# Patient Record
Sex: Female | Born: 1935 | Race: White | Hispanic: Yes | Smoking: Never smoker
Health system: Southern US, Community
[De-identification: ages and names within clinical notes are randomized; demographics above are authoritative.]

## PROBLEM LIST (undated history)

## (undated) DIAGNOSIS — R0602 Shortness of breath: Secondary | ICD-10-CM

## (undated) DIAGNOSIS — I2510 Atherosclerotic heart disease of native coronary artery without angina pectoris: Secondary | ICD-10-CM

## (undated) DIAGNOSIS — Z95810 Presence of automatic (implantable) cardiac defibrillator: Secondary | ICD-10-CM

## (undated) DIAGNOSIS — R609 Edema, unspecified: Secondary | ICD-10-CM

## (undated) DIAGNOSIS — Z950 Presence of cardiac pacemaker: Secondary | ICD-10-CM

## (undated) DIAGNOSIS — I10 Essential (primary) hypertension: Secondary | ICD-10-CM

## (undated) DIAGNOSIS — R55 Syncope and collapse: Secondary | ICD-10-CM

## (undated) DIAGNOSIS — W19XXXA Unspecified fall, initial encounter: Secondary | ICD-10-CM

## (undated) DIAGNOSIS — E119 Type 2 diabetes mellitus without complications: Secondary | ICD-10-CM

## (undated) HISTORY — PX: BACK SURGERY: SHX140

## (undated) HISTORY — DX: Atherosclerotic heart disease of native coronary artery without angina pectoris: I25.10

## (undated) HISTORY — DX: Edema, unspecified: R60.9

## (undated) HISTORY — DX: Essential (primary) hypertension: I10

## (undated) HISTORY — DX: Type 2 diabetes mellitus without complications: E11.9

## (undated) HISTORY — PX: EYE SURGERY: SHX253

## (undated) HISTORY — DX: Shortness of breath: R06.02

## (undated) HISTORY — DX: Syncope and collapse: R55

---

## 1998-12-19 ENCOUNTER — Ambulatory Visit (HOSPITAL_COMMUNITY): Admission: RE | Admit: 1998-12-19 | Discharge: 1998-12-19 | Payer: Self-pay

## 2002-06-25 ENCOUNTER — Encounter: Payer: Self-pay | Admitting: Emergency Medicine

## 2002-06-25 ENCOUNTER — Emergency Department (HOSPITAL_COMMUNITY): Admission: EM | Admit: 2002-06-25 | Discharge: 2002-06-25 | Payer: Self-pay | Admitting: Emergency Medicine

## 2003-09-11 ENCOUNTER — Ambulatory Visit (HOSPITAL_COMMUNITY): Admission: RE | Admit: 2003-09-11 | Discharge: 2003-09-11 | Payer: Self-pay | Admitting: Family Medicine

## 2003-10-22 ENCOUNTER — Encounter (INDEPENDENT_AMBULATORY_CARE_PROVIDER_SITE_OTHER): Payer: Self-pay | Admitting: Specialist

## 2003-10-22 ENCOUNTER — Encounter: Admission: RE | Admit: 2003-10-22 | Discharge: 2003-10-22 | Payer: Self-pay | Admitting: Internal Medicine

## 2003-11-11 ENCOUNTER — Encounter (INDEPENDENT_AMBULATORY_CARE_PROVIDER_SITE_OTHER): Payer: Self-pay | Admitting: *Deleted

## 2003-11-11 ENCOUNTER — Ambulatory Visit (HOSPITAL_COMMUNITY): Admission: RE | Admit: 2003-11-11 | Discharge: 2003-11-11 | Payer: Self-pay | Admitting: General Surgery

## 2003-11-11 ENCOUNTER — Ambulatory Visit (HOSPITAL_BASED_OUTPATIENT_CLINIC_OR_DEPARTMENT_OTHER): Admission: RE | Admit: 2003-11-11 | Discharge: 2003-11-11 | Payer: Self-pay | Admitting: General Surgery

## 2003-11-11 ENCOUNTER — Encounter: Admission: RE | Admit: 2003-11-11 | Discharge: 2003-11-11 | Payer: Self-pay | Admitting: General Surgery

## 2003-11-12 ENCOUNTER — Encounter (INDEPENDENT_AMBULATORY_CARE_PROVIDER_SITE_OTHER): Payer: Self-pay | Admitting: Cardiology

## 2003-11-12 ENCOUNTER — Ambulatory Visit (HOSPITAL_COMMUNITY): Admission: RE | Admit: 2003-11-12 | Discharge: 2003-11-12 | Payer: Self-pay | Admitting: Internal Medicine

## 2004-03-23 ENCOUNTER — Ambulatory Visit: Payer: Self-pay | Admitting: Nurse Practitioner

## 2004-05-18 ENCOUNTER — Ambulatory Visit: Payer: Self-pay | Admitting: Nurse Practitioner

## 2004-06-22 ENCOUNTER — Ambulatory Visit: Payer: Self-pay | Admitting: Nurse Practitioner

## 2004-07-17 ENCOUNTER — Ambulatory Visit: Payer: Self-pay | Admitting: Family Medicine

## 2004-07-27 ENCOUNTER — Ambulatory Visit: Payer: Self-pay | Admitting: Nurse Practitioner

## 2004-10-14 ENCOUNTER — Ambulatory Visit: Payer: Self-pay | Admitting: Nurse Practitioner

## 2004-11-23 ENCOUNTER — Encounter: Admission: RE | Admit: 2004-11-23 | Discharge: 2004-11-23 | Payer: Self-pay | Admitting: General Surgery

## 2005-03-04 ENCOUNTER — Ambulatory Visit: Payer: Self-pay | Admitting: Nurse Practitioner

## 2005-04-07 ENCOUNTER — Ambulatory Visit: Payer: Self-pay | Admitting: Nurse Practitioner

## 2005-07-16 ENCOUNTER — Ambulatory Visit: Payer: Self-pay | Admitting: Nurse Practitioner

## 2005-07-23 ENCOUNTER — Ambulatory Visit: Payer: Self-pay | Admitting: Ophthalmology

## 2005-07-28 ENCOUNTER — Ambulatory Visit: Payer: Self-pay | Admitting: Ophthalmology

## 2005-11-17 ENCOUNTER — Ambulatory Visit: Payer: Self-pay | Admitting: Nurse Practitioner

## 2005-12-29 ENCOUNTER — Encounter: Admission: RE | Admit: 2005-12-29 | Discharge: 2005-12-29 | Payer: Self-pay | Admitting: General Surgery

## 2006-02-22 ENCOUNTER — Ambulatory Visit: Payer: Self-pay | Admitting: Nurse Practitioner

## 2006-07-08 ENCOUNTER — Ambulatory Visit: Payer: Self-pay | Admitting: Nurse Practitioner

## 2006-09-28 ENCOUNTER — Ambulatory Visit: Payer: Self-pay | Admitting: Nurse Practitioner

## 2006-12-02 ENCOUNTER — Ambulatory Visit: Payer: Self-pay | Admitting: Nurse Practitioner

## 2007-01-30 ENCOUNTER — Ambulatory Visit (HOSPITAL_COMMUNITY): Admission: RE | Admit: 2007-01-30 | Discharge: 2007-01-30 | Payer: Self-pay | Admitting: Nurse Practitioner

## 2007-02-23 ENCOUNTER — Ambulatory Visit: Payer: Self-pay | Admitting: Internal Medicine

## 2007-02-23 ENCOUNTER — Encounter (INDEPENDENT_AMBULATORY_CARE_PROVIDER_SITE_OTHER): Payer: Self-pay | Admitting: Nurse Practitioner

## 2007-02-23 LAB — CONVERTED CEMR LAB
ALT: 46 units/L — ABNORMAL HIGH (ref 0–35)
AST: 51 units/L — ABNORMAL HIGH (ref 0–37)
Basophils Relative: 0 % (ref 0–1)
CO2: 25 meq/L (ref 19–32)
Chloride: 106 meq/L (ref 96–112)
Eosinophils Absolute: 0.5 10*3/uL (ref 0.0–0.7)
Glucose, Bld: 149 mg/dL — ABNORMAL HIGH (ref 70–99)
HCT: 44.5 % (ref 36.0–46.0)
MCHC: 31.5 g/dL (ref 30.0–36.0)
MCV: 85.7 fL (ref 78.0–100.0)
Monocytes Absolute: 0.7 10*3/uL (ref 0.2–0.7)
Neutrophils Relative %: 54 % (ref 43–77)
Platelets: 206 10*3/uL (ref 150–400)
Potassium: 5 meq/L (ref 3.5–5.3)
Sodium: 143 meq/L (ref 135–145)
TSH: 8.915 microintl units/mL — ABNORMAL HIGH (ref 0.350–5.50)
Total Bilirubin: 0.3 mg/dL (ref 0.3–1.2)
WBC: 9 10*3/uL (ref 4.0–10.5)

## 2007-02-24 ENCOUNTER — Encounter (INDEPENDENT_AMBULATORY_CARE_PROVIDER_SITE_OTHER): Payer: Self-pay | Admitting: Nurse Practitioner

## 2007-02-24 LAB — CONVERTED CEMR LAB: TSH: 8.915 microintl units/mL

## 2007-05-09 ENCOUNTER — Encounter (INDEPENDENT_AMBULATORY_CARE_PROVIDER_SITE_OTHER): Payer: Self-pay | Admitting: Nurse Practitioner

## 2007-05-09 DIAGNOSIS — E119 Type 2 diabetes mellitus without complications: Secondary | ICD-10-CM | POA: Insufficient documentation

## 2007-05-09 DIAGNOSIS — Z8619 Personal history of other infectious and parasitic diseases: Secondary | ICD-10-CM

## 2007-05-09 DIAGNOSIS — K219 Gastro-esophageal reflux disease without esophagitis: Secondary | ICD-10-CM

## 2007-05-09 DIAGNOSIS — I10 Essential (primary) hypertension: Secondary | ICD-10-CM | POA: Insufficient documentation

## 2007-05-09 DIAGNOSIS — M25569 Pain in unspecified knee: Secondary | ICD-10-CM | POA: Insufficient documentation

## 2007-05-09 DIAGNOSIS — M545 Low back pain: Secondary | ICD-10-CM

## 2007-05-09 DIAGNOSIS — E0590 Thyrotoxicosis, unspecified without thyrotoxic crisis or storm: Secondary | ICD-10-CM | POA: Insufficient documentation

## 2007-05-09 DIAGNOSIS — J309 Allergic rhinitis, unspecified: Secondary | ICD-10-CM | POA: Insufficient documentation

## 2008-10-05 ENCOUNTER — Inpatient Hospital Stay (HOSPITAL_COMMUNITY): Admission: EM | Admit: 2008-10-05 | Discharge: 2008-10-10 | Payer: Self-pay | Admitting: Emergency Medicine

## 2008-10-05 ENCOUNTER — Ambulatory Visit: Payer: Self-pay | Admitting: Internal Medicine

## 2008-10-07 ENCOUNTER — Encounter (INDEPENDENT_AMBULATORY_CARE_PROVIDER_SITE_OTHER): Payer: Self-pay | Admitting: Emergency Medicine

## 2008-10-21 ENCOUNTER — Encounter: Payer: Self-pay | Admitting: Cardiovascular Disease

## 2008-10-21 ENCOUNTER — Emergency Department (HOSPITAL_COMMUNITY): Admission: EM | Admit: 2008-10-21 | Discharge: 2008-10-21 | Payer: Self-pay | Admitting: Emergency Medicine

## 2009-03-11 ENCOUNTER — Encounter: Admission: RE | Admit: 2009-03-11 | Discharge: 2009-03-11 | Payer: Self-pay | Admitting: Internal Medicine

## 2009-03-12 ENCOUNTER — Encounter: Admission: RE | Admit: 2009-03-12 | Discharge: 2009-03-12 | Payer: Self-pay | Admitting: Internal Medicine

## 2009-03-20 ENCOUNTER — Encounter: Payer: Self-pay | Admitting: Internal Medicine

## 2009-03-26 ENCOUNTER — Ambulatory Visit (HOSPITAL_COMMUNITY): Admission: RE | Admit: 2009-03-26 | Discharge: 2009-03-26 | Payer: Self-pay | Admitting: Interventional Radiology

## 2009-04-16 ENCOUNTER — Encounter: Payer: Self-pay | Admitting: Interventional Radiology

## 2009-04-22 ENCOUNTER — Ambulatory Visit: Payer: Self-pay | Admitting: Internal Medicine

## 2009-04-22 DIAGNOSIS — J984 Other disorders of lung: Secondary | ICD-10-CM

## 2009-05-01 ENCOUNTER — Ambulatory Visit (HOSPITAL_COMMUNITY): Admission: RE | Admit: 2009-05-01 | Discharge: 2009-05-01 | Payer: Self-pay | Admitting: Interventional Radiology

## 2009-05-07 ENCOUNTER — Ambulatory Visit (HOSPITAL_COMMUNITY): Admission: RE | Admit: 2009-05-07 | Discharge: 2009-05-07 | Payer: Self-pay | Admitting: Interventional Radiology

## 2009-05-07 ENCOUNTER — Encounter (INDEPENDENT_AMBULATORY_CARE_PROVIDER_SITE_OTHER): Payer: Self-pay | Admitting: Interventional Radiology

## 2009-05-21 ENCOUNTER — Ambulatory Visit (HOSPITAL_COMMUNITY): Admission: RE | Admit: 2009-05-21 | Discharge: 2009-05-21 | Payer: Self-pay | Admitting: Interventional Radiology

## 2009-05-30 ENCOUNTER — Ambulatory Visit: Payer: Self-pay | Admitting: Internal Medicine

## 2009-06-09 ENCOUNTER — Encounter: Admission: RE | Admit: 2009-06-09 | Discharge: 2009-06-09 | Payer: Self-pay | Admitting: Internal Medicine

## 2009-06-09 ENCOUNTER — Ambulatory Visit: Payer: Self-pay | Admitting: Internal Medicine

## 2009-06-16 ENCOUNTER — Ambulatory Visit: Payer: Self-pay | Admitting: Internal Medicine

## 2009-07-01 ENCOUNTER — Emergency Department (HOSPITAL_COMMUNITY): Admission: EM | Admit: 2009-07-01 | Discharge: 2009-07-01 | Payer: Self-pay | Admitting: Family Medicine

## 2009-07-02 ENCOUNTER — Ambulatory Visit: Payer: Self-pay | Admitting: Internal Medicine

## 2009-07-02 ENCOUNTER — Inpatient Hospital Stay (HOSPITAL_COMMUNITY): Admission: EM | Admit: 2009-07-02 | Discharge: 2009-07-07 | Payer: Self-pay | Admitting: Emergency Medicine

## 2009-07-02 ENCOUNTER — Encounter: Payer: Self-pay | Admitting: Cardiology

## 2009-07-29 ENCOUNTER — Ambulatory Visit: Payer: Self-pay | Admitting: Internal Medicine

## 2010-06-26 ENCOUNTER — Ambulatory Visit: Payer: Self-pay | Admitting: Cardiology

## 2010-07-02 ENCOUNTER — Ambulatory Visit: Payer: Self-pay | Admitting: Ophthalmology

## 2010-07-02 ENCOUNTER — Telehealth: Payer: Self-pay | Admitting: Internal Medicine

## 2010-07-15 ENCOUNTER — Ambulatory Visit: Payer: Self-pay | Admitting: Ophthalmology

## 2010-07-23 ENCOUNTER — Ambulatory Visit
Admission: RE | Admit: 2010-07-23 | Discharge: 2010-07-23 | Payer: Self-pay | Source: Home / Self Care | Attending: Internal Medicine | Admitting: Internal Medicine

## 2010-08-04 NOTE — Miscellaneous (Signed)
Summary: Care Plan/[facility]  Care Plan/[facility]   Imported By: Lester Estancia 08/05/2009 08:45:15  _____________________________________________________________________  External Attachment:    Type:   Image     Comment:   External Document

## 2010-08-04 NOTE — Miscellaneous (Signed)
Summary: Orders: OT,PT,MS/[facility]  Orders: OT,PT,MS/[facility]   Imported By: Lester Linden 08/05/2009 08:50:22  _____________________________________________________________________  External Attachment:    Type:   Image     Comment:   External Document

## 2010-08-06 NOTE — Assessment & Plan Note (Signed)
Summary: 1 yr follow-up//jrc   Visit Type:  Follow-up Copy to:  Dr. Pearson Grippe Primary Provider/Referring Provider:  Dr. Pearson Grippe  CC:  Pt here for follow-up to discuss CT results.  Pt has no complaints at this time.Marland Kitchen  History of Present Illness: OV 06/18/2009. Followup falls and RLL 7mm nodule in Chest from 03/11/2009. Presents as usual with daughter and dutiful son in Social worker. Since last visit, overall health is improved. Less falls. Still having those bizzare seizure like epsiodes. Family notices that stress tends to precipitate them. Awaiting 2nd opinion at [facility]. She has now learned to predict those episodes and gets help before hand. Family struggling with level of care she is needing and requesting home health aide. No other complaints esp from resp standpoing. REC: CT CHEST DEC 2011 for nodule   July 23, 2010: Followup RLL 7mm nodule. Since last visit she has been doing better. Had pacer placed at [facility] and after that bizarre seizure episodes resolved. Currently still living iwth duaghter and son in law. Doing some ADLs like self care. No complaints. Just became [place] citizen. No respiratory complaints. CT chest dec 2011 shows no change in RLLnodule.    Preventive Screening-Counseling & Management  Alcohol-Tobacco     Smoking Status: never     Passive Smoke Exposure: yes  Current Medications (verified): 1)  Simvastatin 10 Mg Tabs (Simvastatin) .... Take 1 Tablet By Mouth Once A Day 2)  Lorazepam 0.5 Mg Tabs (Lorazepam) .... Take 1 Tablet By Mouth Once A Day 3)  Amlodipine Besylate 5 Mg Tabs (Amlodipine Besylate) .... Take 1 Tablet By Mouth Once A Day 4)  Aspirin 81 Mg Tbec (Aspirin) .... Take 1 Tablet By Mouth Once A Day 5)  Duotact 30/2 Mg .... Take 1 Tablet By Mouth Once A Day 6)  Metoprolol Tartrate 25 Mg Tabs (Metoprolol Tartrate) .... Take 1 Tablet By Mouth Two Times A Day 7)  Lisinopril 20 Mg Tabs (Lisinopril) .... Take 1 Tablet By Mouth Once A Day  Allergies (verified): No  Known Drug Allergies  Past History:  Past medical, surgical, family and social histories (including risk factors) reviewed, and no changes noted (except as noted below).  Past Medical History: Reviewed history from 04/22/2009 and no changes required. Allergic rhinitis Diabetes mellitus, type II GERD Hepatitis B, hx of Hypertension Hyperthyroidism #Multiple Falls Hx................Marland KitchenDr Jorja Loa Lane/Dr Pearlean Brownie >Admited April 201o for syncope v seizure. had positive cardiac workup #L1 acute fracture from falls...............Marland KitchenDr Titus Dubin >Dxed sept 2010. s/p L1 verterbroplasty #Ischemic Cardiomyopathy..............Marland KitchenDr Elease Hashimoto [facility] 10/09/2008.  Single-vessel coronary artery disease involving the RCA 70%. > ECHO 10/07/2008:  Left ventricular ejection fraction was estimated , range being 20         % to 25 %.   -  There was dyskinesis of the entire anteroseptal wall and akinesis         of the inferoposterior wall from mid to apex. It was         difficult to visualize other segments.   -  Left ventricular wall thickness was mildly increased  Past Surgical History: Reviewed history from 04/22/2009 and no changes required. Left Eye Surgery Back Surgery  The patient has had previous right breast   excisional biopsy in May 2005.  She has had previous right knee   surgery. She has also had cataract surgery. L1 vertebroplasty on March 26, 2009   for a severe compression fracture.  Family History: Reviewed history from 04/22/2009 and no changes required. FAther-Stroke Mother-died from  colon cancer  Her mother died of colon cancer at age 51.  Her   father died from a stroke at age 49.  The patient has 2 sisters and 2   brothers who are still living.  Social History: Reviewed history from 04/22/2009 and no changes required. Never smoked, but husband smoked for many years.  Widowed Lives with daughter From [place], [place] The patient has 4 sons and 4 daughters all of whom   are alive and  well.  The patient lives in [place] with her   daughter and son-in-law.  She is a nonsmoker and nondrinker.  passively smoked for 32 years of maried life qutting in 1992Smoking Status:  never Passive Smoke Exposure:  yes  Review of Systems      See HPI  The patient denies anorexia, fever, weight loss, weight gain, vision loss, decreased hearing, hoarseness, chest pain, syncope, dyspnea on exertion, peripheral edema, prolonged cough, headaches, hemoptysis, abdominal pain, melena, hematochezia, severe indigestion/heartburn, hematuria, incontinence, genital sores, muscle weakness, suspicious skin lesions, transient blindness, difficulty walking, depression, unusual weight change, abnormal bleeding, enlarged lymph nodes, angioedema, breast masses, and testicular masses.    Vital Signs:  Patient profile:   77 year old female Height:      62 inches Weight:      187.25 pounds BMI:     34.37 O2 Sat:      92 % on Room air Temp:     98.1 degrees F oral Pulse rate:   72 / minute Cuff size:   regular  Vitals Entered By: Carron Curie CMA (July 23, 2010 4:11 PM)  O2 Flow:  Room air CC: Pt here for follow-up to discuss CT results.  Pt has no complaints at this time. Comments Medications reviewed with patient Carron Curie CMA  July 23, 2010 4:12 PM Daytime phone number verified with patient.    Physical Exam  General:  elderly female chronic unwell but looking better pleasant cheeful  daughter is interpreter Head:  normocephalic and atraumatic Eyes:  PERRLA/EOM intact; conjunctiva and sclera clear Ears:  TMs intact and clear with normal canals Nose:  no deformity, discharge, inflammation, or lesions Mouth:  no deformity or lesions Neck:  no masses, thyromegaly, or abnormal cervical nodes Chest Wall:  no deformities noted Lungs:  clear bilaterally to auscultation and percussion crackles resolved Heart:  regular rate and rhythm, S1, S2 without murmurs, rubs, gallops,  or clicks Abdomen:  bowel sounds positive; abdomen soft and non-tender without masses, or organomegaly Msk:  no deformity or scoliosis noted with normal posture Pulses:  pulses normal Extremities:  no clubbing, cyanosis, edema, or deformity noted Neurologic:  CN II-XII grossly intact with normal reflexes, coordination, muscle strength and tone Skin:  intact without lesions or rashes Cervical Nodes:  no significant adenopathy Axillary Nodes:  no significant adenopathy Psych:  alert and cooperative; normal mood and affect; normal attention span and concentration   CT of Chest  Procedure date:  06/26/2010  Findings:      IMPRESSION:   1.  New permanent left-sided pacemaker without complicating features. 2.  Stable left thyroid nodules. 3.  Stable cardiac enlargement.  New small pericardial effusion. 4.  Stable 7.5 mm right lower lobe pulmonary nodule.  A follow-up noncontrast chest CT in December 2012 suggested for a final 2-year follow-up.   Read By:  Cyndie Chime,  M.D.     Released By:  Cyndie Chime,  M.D.    Comments:  independently reviwed and agree  Impression & Recommendations:  Problem # 1:  PULMONARY NODULE (ICD-518.89) Assessment Unchanged  Orders: Radiology Referral (Radiology) Est. Patient Level III (2162)  77 year old passive msoker   9mm RLL nodule 03/31/2009 CT abd lung cuts Reduced to 7mm RLL nodule 06/09/2009 Unchanged at 7.4mm on 06/26/2010  Therefore, very low prob for lung cancer  plan too small to biopsy repeat cT chest in 06/2011 - 2 years if fu Ct chest shows no change in nodule and phone conversation reveals continued good health then will dc from fu  over phone (they are having $ issues)  Problem # 2:  ACCIDENTAL FALLS, RECURRENT (ICD-E888.9) Assessment: Improved  resolved after pace placement  plan per PMD  Orders: Est. Patient Level III (62952)  Medications Added to Medication List This Visit: 1)  Metoprolol  Tartrate 25 Mg Tabs (Metoprolol tartrate) .... Take 1 tablet by mouth two times a day 2)  Lisinopril 20 Mg Tabs (Lisinopril) .... Take 1 tablet by mouth once a day  Patient Instructions: 1)  glad nodule has not grown in lung 2)  glad you are doing better overall 3)  have CT chest in Dec 2012 4)  I will review results and d/w you over phone 5)  fu depending on ct results 6)  come sooner for any chest complaints 7)  if nodule has not grown and no symptoms you do not need to come in to dicuss ct results   Not Administered:    Influenza Vaccine # 1 not given due to: declined

## 2010-08-06 NOTE — Progress Notes (Signed)
Summary: returning a call from Jennifer>ct results  Phone Note Call from Patient Call back at Home Phone [phone]   Caller: Son in law/Kim Harrleson Call For: Ramaswamy Summary of Call: Patients sone in law Selena Batten phoned stated that he was returning a call from [facility] yesterday. They can be reached at [phone] Initial call taken by: Vedia Coffer,  July 02, 2010 9:03 AM  Follow-up for Phone Call        pt daughter is aware of ct results and appt made to see MR on 07-23-10 at 4:10.Carron Curie CMA  July 02, 2010 9:53 AM

## 2010-08-11 ENCOUNTER — Other Ambulatory Visit: Payer: Self-pay | Admitting: Orthopedic Surgery

## 2010-08-11 ENCOUNTER — Ambulatory Visit
Admission: RE | Admit: 2010-08-11 | Discharge: 2010-08-11 | Disposition: A | Discharge status: Home / Self Care | Payer: Medicare Other | Source: Ambulatory Visit | Attending: Orthopedic Surgery | Admitting: Orthopedic Surgery

## 2010-08-11 DIAGNOSIS — T148XXA Other injury of unspecified body region, initial encounter: Secondary | ICD-10-CM

## 2010-08-14 ENCOUNTER — Ambulatory Visit (HOSPITAL_COMMUNITY)
Admission: RE | Admit: 2010-08-14 | Discharge: 2010-08-14 | Disposition: A | Discharge status: Home / Self Care | Payer: Medicare Other | Source: Ambulatory Visit | Attending: Emergency Medicine | Admitting: Emergency Medicine

## 2010-08-14 ENCOUNTER — Emergency Department (HOSPITAL_COMMUNITY)
Admission: EM | Admit: 2010-08-14 | Discharge: 2010-08-14 | Disposition: A | Discharge status: Home / Self Care | Payer: Medicare Other | Attending: Emergency Medicine | Admitting: Emergency Medicine

## 2010-08-14 ENCOUNTER — Emergency Department (HOSPITAL_COMMUNITY): Payer: Medicare Other

## 2010-08-14 DIAGNOSIS — R569 Unspecified convulsions: Secondary | ICD-10-CM | POA: Insufficient documentation

## 2010-08-14 DIAGNOSIS — E119 Type 2 diabetes mellitus without complications: Secondary | ICD-10-CM | POA: Insufficient documentation

## 2010-08-14 DIAGNOSIS — I10 Essential (primary) hypertension: Secondary | ICD-10-CM | POA: Insufficient documentation

## 2010-08-14 DIAGNOSIS — M7989 Other specified soft tissue disorders: Secondary | ICD-10-CM

## 2010-08-14 LAB — POCT I-STAT, CHEM 8
BUN: 28 mg/dL — ABNORMAL HIGH (ref 6–23)
Chloride: 105 mEq/L (ref 96–112)
Creatinine, Ser: 0.8 mg/dL (ref 0.4–1.2)
Sodium: 143 mEq/L (ref 135–145)
TCO2: 30 mmol/L (ref 0–100)

## 2010-08-14 LAB — PROTIME-INR: INR: 0.94 (ref 0.00–1.49)

## 2010-09-20 LAB — GLUCOSE, CAPILLARY
Glucose-Capillary: 121 mg/dL — ABNORMAL HIGH (ref 70–99)
Glucose-Capillary: 137 mg/dL — ABNORMAL HIGH (ref 70–99)
Glucose-Capillary: 172 mg/dL — ABNORMAL HIGH (ref 70–99)
Glucose-Capillary: 192 mg/dL — ABNORMAL HIGH (ref 70–99)
Glucose-Capillary: 198 mg/dL — ABNORMAL HIGH (ref 70–99)
Glucose-Capillary: 220 mg/dL — ABNORMAL HIGH (ref 70–99)

## 2010-09-20 LAB — BASIC METABOLIC PANEL
BUN: 24 mg/dL — ABNORMAL HIGH (ref 6–23)
BUN: 25 mg/dL — ABNORMAL HIGH (ref 6–23)
BUN: 27 mg/dL — ABNORMAL HIGH (ref 6–23)
CO2: 28 mEq/L (ref 19–32)
CO2: 30 mEq/L (ref 19–32)
Calcium: 9.7 mg/dL (ref 8.4–10.5)
Chloride: 104 mEq/L (ref 96–112)
Chloride: 107 mEq/L (ref 96–112)
Creatinine, Ser: 0.68 mg/dL (ref 0.4–1.2)
GFR calc Af Amer: >60 mL/min (ref >60)
GFR calc non Af Amer: >60 mL/min (ref >60)
Glucose, Bld: 143 mg/dL — ABNORMAL HIGH (ref 70–99)
Potassium: 4.2 mEq/L (ref 3.5–5.1)
Potassium: 4.6 mEq/L (ref 3.5–5.1)
Sodium: 141 mEq/L (ref 135–145)

## 2010-10-05 LAB — BASIC METABOLIC PANEL WITH GFR
BUN: 23 mg/dL (ref 6–23)
BUN: 33 mg/dL — ABNORMAL HIGH (ref 6–23)
CO2: 30 meq/L (ref 19–32)
CO2: 33 meq/L — ABNORMAL HIGH (ref 19–32)
Calcium: 9 mg/dL (ref 8.4–10.5)
Calcium: 9.2 mg/dL (ref 8.4–10.5)
Chloride: 104 meq/L (ref 96–112)
Chloride: 98 meq/L (ref 96–112)
Creatinine, Ser: 0.66 mg/dL (ref 0.4–1.2)
Creatinine, Ser: 1.03 mg/dL (ref 0.4–1.2)
GFR calc non Af Amer: 52 mL/min — ABNORMAL LOW
GFR calc non Af Amer: >60 mL/min
Glucose, Bld: 118 mg/dL — ABNORMAL HIGH (ref 70–99)
Glucose, Bld: 152 mg/dL — ABNORMAL HIGH (ref 70–99)
Potassium: 3.9 meq/L (ref 3.5–5.1)
Potassium: 4.1 meq/L (ref 3.5–5.1)
Sodium: 141 meq/L (ref 135–145)
Sodium: 142 meq/L (ref 135–145)

## 2010-10-05 LAB — BASIC METABOLIC PANEL
BUN: 25 mg/dL — ABNORMAL HIGH (ref 6–23)
Chloride: 106 mEq/L (ref 96–112)
Creatinine, Ser: 0.65 mg/dL (ref 0.4–1.2)
GFR calc Af Amer: >60 mL/min (ref >60)
GFR calc non Af Amer: >60 mL/min (ref >60)

## 2010-10-05 LAB — CULTURE, BLOOD (ROUTINE X 2)
Culture: NO GROWTH
Culture: NO GROWTH

## 2010-10-05 LAB — COMPREHENSIVE METABOLIC PANEL WITH GFR
ALT: 22 U/L (ref 0–35)
ALT: 23 U/L (ref 0–35)
AST: 26 U/L (ref 0–37)
AST: 30 U/L (ref 0–37)
Albumin: 3 g/dL — ABNORMAL LOW (ref 3.5–5.2)
Albumin: 3 g/dL — ABNORMAL LOW (ref 3.5–5.2)
Alkaline Phosphatase: 95 U/L (ref 39–117)
Alkaline Phosphatase: 99 U/L (ref 39–117)
BUN: 24 mg/dL — ABNORMAL HIGH (ref 6–23)
BUN: 28 mg/dL — ABNORMAL HIGH (ref 6–23)
CO2: 30 meq/L (ref 19–32)
CO2: 32 meq/L (ref 19–32)
Calcium: 8.9 mg/dL (ref 8.4–10.5)
Calcium: 9 mg/dL (ref 8.4–10.5)
Chloride: 99 meq/L (ref 96–112)
Chloride: 99 meq/L (ref 96–112)
Creatinine, Ser: 0.77 mg/dL (ref 0.4–1.2)
Creatinine, Ser: 0.77 mg/dL (ref 0.4–1.2)
GFR calc non Af Amer: >60 mL/min
GFR calc non Af Amer: >60 mL/min
Glucose, Bld: 109 mg/dL — ABNORMAL HIGH (ref 70–99)
Glucose, Bld: 185 mg/dL — ABNORMAL HIGH (ref 70–99)
Potassium: 3 meq/L — ABNORMAL LOW (ref 3.5–5.1)
Potassium: 3 meq/L — ABNORMAL LOW (ref 3.5–5.1)
Sodium: 140 meq/L (ref 135–145)
Sodium: 142 meq/L (ref 135–145)
Total Bilirubin: 0.3 mg/dL (ref 0.3–1.2)
Total Bilirubin: 0.3 mg/dL (ref 0.3–1.2)
Total Protein: 7.3 g/dL (ref 6.0–8.3)
Total Protein: 7.5 g/dL (ref 6.0–8.3)

## 2010-10-05 LAB — GLUCOSE, CAPILLARY
Glucose-Capillary: 102 mg/dL — ABNORMAL HIGH (ref 70–99)
Glucose-Capillary: 105 mg/dL — ABNORMAL HIGH (ref 70–99)
Glucose-Capillary: 105 mg/dL — ABNORMAL HIGH (ref 70–99)
Glucose-Capillary: 105 mg/dL — ABNORMAL HIGH (ref 70–99)
Glucose-Capillary: 123 mg/dL — ABNORMAL HIGH (ref 70–99)
Glucose-Capillary: 126 mg/dL — ABNORMAL HIGH (ref 70–99)
Glucose-Capillary: 126 mg/dL — ABNORMAL HIGH (ref 70–99)
Glucose-Capillary: 157 mg/dL — ABNORMAL HIGH (ref 70–99)
Glucose-Capillary: 192 mg/dL — ABNORMAL HIGH (ref 70–99)
Glucose-Capillary: 98 mg/dL (ref 70–99)

## 2010-10-05 LAB — CARDIAC PANEL(CRET KIN+CKTOT+MB+TROPI)
CK, MB: 1.2 ng/mL (ref 0.3–4.0)
CK, MB: 1.5 ng/mL (ref 0.3–4.0)
Relative Index: 1.2 (ref 0.0–2.5)
Relative Index: INVALID (ref 0.0–2.5)
Total CK: 104 U/L (ref 7–177)
Total CK: 92 U/L (ref 7–177)

## 2010-10-05 LAB — CBC
HCT: 38.9 % (ref 36.0–46.0)
Hemoglobin: 13 g/dL (ref 12.0–15.0)
MCHC: 33.4 g/dL (ref 30.0–36.0)
MCV: 84.3 fL (ref 78.0–100.0)
MCV: 85.1 fL (ref 78.0–100.0)
Platelets: 208 10*3/uL (ref 150–400)
Platelets: 216 K/uL (ref 150–400)
RBC: 4.43 MIL/uL (ref 3.87–5.11)
RBC: 4.61 MIL/uL (ref 3.87–5.11)
RDW: 15 % (ref 11.5–15.5)
WBC: 9.6 K/uL (ref 4.0–10.5)
WBC: 9.7 10*3/uL (ref 4.0–10.5)

## 2010-10-05 LAB — CK TOTAL AND CKMB (NOT AT ARMC)
CK, MB: 2.4 ng/mL (ref 0.3–4.0)
Relative Index: 2.3 (ref 0.0–2.5)
Total CK: 104 U/L (ref 7–177)

## 2010-10-05 LAB — POCT CARDIAC MARKERS
CKMB, poc: 2.2 ng/mL (ref 1.0–8.0)
Myoglobin, poc: 146 ng/mL (ref 12–200)
Troponin i, poc: <0.05 ng/mL (ref 0.00–0.09)

## 2010-10-05 LAB — URINE CULTURE
Colony Count: NO GROWTH
Culture: NO GROWTH

## 2010-10-05 LAB — APTT: aPTT: 33 s (ref 24–37)

## 2010-10-05 LAB — DIFFERENTIAL
Eosinophils Absolute: 0.2 10*3/uL (ref 0.0–0.7)
Lymphocytes Relative: 18 % (ref 12–46)
Lymphs Abs: 1.7 10*3/uL (ref 0.7–4.0)
Monocytes Relative: 10 % (ref 3–12)
Neutro Abs: 6.7 10*3/uL (ref 1.7–7.7)
Neutrophils Relative %: 69 % (ref 43–77)

## 2010-10-05 LAB — URINALYSIS, ROUTINE W REFLEX MICROSCOPIC
Glucose, UA: NEGATIVE mg/dL
Hgb urine dipstick: NEGATIVE
Protein, ur: NEGATIVE mg/dL
Specific Gravity, Urine: 1.024 (ref 1.005–1.030)
pH: 5.5 (ref 5.0–8.0)

## 2010-10-05 LAB — TROPONIN I: Troponin I: 0.08 ng/mL — ABNORMAL HIGH (ref 0.00–0.06)

## 2010-10-05 LAB — BRAIN NATRIURETIC PEPTIDE
Pro B Natriuretic peptide (BNP): 1272 pg/mL — ABNORMAL HIGH (ref 0.0–100.0)
Pro B Natriuretic peptide (BNP): 342 pg/mL — ABNORMAL HIGH (ref 0.0–100.0)
Pro B Natriuretic peptide (BNP): 404 pg/mL — ABNORMAL HIGH (ref 0.0–100.0)

## 2010-10-05 LAB — HEMOGLOBIN A1C
Hgb A1c MFr Bld: 6.7 % — ABNORMAL HIGH (ref 4.6–6.1)
Mean Plasma Glucose: 146 mg/dL

## 2010-10-05 LAB — TSH: TSH: 6.907 u[IU]/mL — ABNORMAL HIGH (ref 0.350–4.500)

## 2010-10-05 LAB — PROTIME-INR
INR: 1.11 (ref 0.00–1.49)
Prothrombin Time: 14.2 s (ref 11.6–15.2)

## 2010-10-07 LAB — PROTIME-INR
INR: 0.92 (ref 0.00–1.49)
Prothrombin Time: 12.3 seconds (ref 11.6–15.2)

## 2010-10-07 LAB — APTT: aPTT: 28 seconds (ref 24–37)

## 2010-10-07 LAB — BASIC METABOLIC PANEL
CO2: 25 mEq/L (ref 19–32)
Calcium: 9.3 mg/dL (ref 8.4–10.5)
Creatinine, Ser: 0.55 mg/dL (ref 0.4–1.2)
GFR calc Af Amer: >60 mL/min (ref >60)
GFR calc non Af Amer: >60 mL/min (ref >60)

## 2010-10-07 LAB — CBC
MCHC: 33.6 g/dL (ref 30.0–36.0)
RBC: 5.32 MIL/uL — ABNORMAL HIGH (ref 3.87–5.11)

## 2010-10-09 LAB — CBC
MCHC: 32.8 g/dL (ref 30.0–36.0)
MCV: 81.6 fL (ref 78.0–100.0)
Platelets: 224 10*3/uL (ref 150–400)
WBC: 8.6 10*3/uL (ref 4.0–10.5)

## 2010-10-09 LAB — BASIC METABOLIC PANEL
BUN: 24 mg/dL — ABNORMAL HIGH (ref 6–23)
CO2: 24 mEq/L (ref 19–32)
Chloride: 109 mEq/L (ref 96–112)
Creatinine, Ser: 0.71 mg/dL (ref 0.4–1.2)

## 2010-10-09 LAB — PROTIME-INR: Prothrombin Time: 13.6 seconds (ref 11.6–15.2)

## 2010-10-09 LAB — GLUCOSE, CAPILLARY: Glucose-Capillary: 128 mg/dL — ABNORMAL HIGH (ref 70–99)

## 2010-10-14 LAB — GLUCOSE, CAPILLARY
Glucose-Capillary: 153 mg/dL — ABNORMAL HIGH (ref 70–99)
Glucose-Capillary: 169 mg/dL — ABNORMAL HIGH (ref 70–99)
Glucose-Capillary: 178 mg/dL — ABNORMAL HIGH (ref 70–99)
Glucose-Capillary: 178 mg/dL — ABNORMAL HIGH (ref 70–99)
Glucose-Capillary: 182 mg/dL — ABNORMAL HIGH (ref 70–99)
Glucose-Capillary: 185 mg/dL — ABNORMAL HIGH (ref 70–99)
Glucose-Capillary: 193 mg/dL — ABNORMAL HIGH (ref 70–99)
Glucose-Capillary: 220 mg/dL — ABNORMAL HIGH (ref 70–99)
Glucose-Capillary: 254 mg/dL — ABNORMAL HIGH (ref 70–99)
Glucose-Capillary: 267 mg/dL — ABNORMAL HIGH (ref 70–99)
Glucose-Capillary: 303 mg/dL — ABNORMAL HIGH (ref 70–99)

## 2010-10-14 LAB — CBC
HCT: 33.8 % — ABNORMAL LOW (ref 36.0–46.0)
HCT: 33.8 % — ABNORMAL LOW (ref 36.0–46.0)
HCT: 34.9 % — ABNORMAL LOW (ref 36.0–46.0)
HCT: 35.3 % — ABNORMAL LOW (ref 36.0–46.0)
HCT: 36.8 % (ref 36.0–46.0)
Hemoglobin: 11.6 g/dL — ABNORMAL LOW (ref 12.0–15.0)
Hemoglobin: 12 g/dL (ref 12.0–15.0)
Hemoglobin: 12 g/dL (ref 12.0–15.0)
MCHC: 33.8 g/dL (ref 30.0–36.0)
MCHC: 34 g/dL (ref 30.0–36.0)
MCHC: 34.2 g/dL (ref 30.0–36.0)
MCHC: 34.3 g/dL (ref 30.0–36.0)
MCV: 82.4 fL (ref 78.0–100.0)
MCV: 82.5 fL (ref 78.0–100.0)
MCV: 82.7 fL (ref 78.0–100.0)
Platelets: 215 10*3/uL (ref 150–400)
Platelets: 229 10*3/uL (ref 150–400)
Platelets: 233 10*3/uL (ref 150–400)
Platelets: 234 10*3/uL (ref 150–400)
Platelets: 242 10*3/uL (ref 150–400)
RBC: 4.18 MIL/uL (ref 3.87–5.11)
RBC: 4.27 MIL/uL (ref 3.87–5.11)
RDW: 13.8 % (ref 11.5–15.5)
RDW: 13.8 % (ref 11.5–15.5)
RDW: 13.9 % (ref 11.5–15.5)
RDW: 14 % (ref 11.5–15.5)
WBC: 11.8 10*3/uL — ABNORMAL HIGH (ref 4.0–10.5)
WBC: 13.8 10*3/uL — ABNORMAL HIGH (ref 4.0–10.5)
WBC: 9.8 10*3/uL (ref 4.0–10.5)

## 2010-10-14 LAB — CARDIAC PANEL(CRET KIN+CKTOT+MB+TROPI)
CK, MB: 1.1 ng/mL (ref 0.3–4.0)
CK, MB: 1.4 ng/mL (ref 0.3–4.0)
Relative Index: 0.8 (ref 0.0–2.5)
Relative Index: 0.9 (ref 0.0–2.5)
Total CK: 157 U/L (ref 7–177)
Troponin I: <0.01 ng/mL (ref 0.00–0.06)

## 2010-10-14 LAB — BASIC METABOLIC PANEL
BUN: 20 mg/dL (ref 6–23)
BUN: 20 mg/dL (ref 6–23)
BUN: 20 mg/dL (ref 6–23)
BUN: 27 mg/dL — ABNORMAL HIGH (ref 6–23)
CO2: 24 mEq/L (ref 19–32)
CO2: 25 mEq/L (ref 19–32)
CO2: 28 mEq/L (ref 19–32)
Calcium: 8.7 mg/dL (ref 8.4–10.5)
Calcium: 9 mg/dL (ref 8.4–10.5)
Calcium: 9.6 mg/dL (ref 8.4–10.5)
Chloride: 101 mEq/L (ref 96–112)
Chloride: 103 mEq/L (ref 96–112)
Creatinine, Ser: 0.59 mg/dL (ref 0.4–1.2)
Creatinine, Ser: 0.59 mg/dL (ref 0.4–1.2)
Creatinine, Ser: 0.7 mg/dL (ref 0.4–1.2)
GFR calc Af Amer: >60 mL/min (ref >60)
GFR calc Af Amer: >60 mL/min (ref >60)
GFR calc non Af Amer: >60 mL/min (ref >60)
GFR calc non Af Amer: >60 mL/min (ref >60)
GFR calc non Af Amer: >60 mL/min (ref >60)
Glucose, Bld: 141 mg/dL — ABNORMAL HIGH (ref 70–99)
Glucose, Bld: 171 mg/dL — ABNORMAL HIGH (ref 70–99)
Glucose, Bld: 237 mg/dL — ABNORMAL HIGH (ref 70–99)
Potassium: 4.1 mEq/L (ref 3.5–5.1)
Potassium: 4.1 mEq/L (ref 3.5–5.1)
Potassium: 4.1 mEq/L (ref 3.5–5.1)
Sodium: 137 mEq/L (ref 135–145)
Sodium: 138 mEq/L (ref 135–145)

## 2010-10-14 LAB — LIPID PANEL
Triglycerides: 68 mg/dL (ref <150)
VLDL: 14 mg/dL (ref 0–40)

## 2010-10-14 LAB — RAPID URINE DRUG SCREEN, HOSP PERFORMED
Amphetamines: NOT DETECTED
Benzodiazepines: NOT DETECTED
Cocaine: NOT DETECTED
Opiates: NOT DETECTED
Tetrahydrocannabinol: NOT DETECTED

## 2010-10-14 LAB — COMPREHENSIVE METABOLIC PANEL
Albumin: 3.5 g/dL (ref 3.5–5.2)
BUN: 21 mg/dL (ref 6–23)
Creatinine, Ser: 0.64 mg/dL (ref 0.4–1.2)
Total Protein: 7.6 g/dL (ref 6.0–8.3)

## 2010-10-14 LAB — MAGNESIUM: Magnesium: 2.1 mg/dL (ref 1.5–2.5)

## 2010-10-14 LAB — URINALYSIS, ROUTINE W REFLEX MICROSCOPIC
Bilirubin Urine: NEGATIVE
Hgb urine dipstick: NEGATIVE
Nitrite: NEGATIVE
Specific Gravity, Urine: 1.017 (ref 1.005–1.030)
pH: 5.5 (ref 5.0–8.0)

## 2010-10-14 LAB — DIFFERENTIAL
Basophils Absolute: 0.1 10*3/uL (ref 0.0–0.1)
Lymphocytes Relative: 22 % (ref 12–46)
Monocytes Absolute: 1 10*3/uL (ref 0.1–1.0)
Monocytes Relative: 11 % (ref 3–12)
Neutro Abs: 5.7 10*3/uL (ref 1.7–7.7)

## 2010-10-14 LAB — CULTURE, BLOOD (ROUTINE X 2)

## 2010-10-14 LAB — URINE MICROSCOPIC-ADD ON

## 2010-10-14 LAB — APTT: aPTT: 29 seconds (ref 24–37)

## 2010-10-14 LAB — PROLACTIN: Prolactin: 11 ng/mL

## 2010-10-14 LAB — CK TOTAL AND CKMB (NOT AT ARMC): Relative Index: 0.8 (ref 0.0–2.5)

## 2010-10-14 LAB — POCT CARDIAC MARKERS: Myoglobin, poc: 82.1 ng/mL (ref 12–200)

## 2010-11-17 NOTE — Cardiovascular Report (Signed)
[name], [name]                ACCOUNT NO.:  [id number]   MEDICAL RECORD NO.:  [id number]          PATIENT TYPE:  INP   LOCATION:  4706                         FACILITY:  [facility]   PHYSICIAN:  Vesta Mixer, M.D. DATE OF BIRTH:  December 14, 1926   DATE OF PROCEDURE:  10/09/2008  DATE OF DISCHARGE:  10/10/2008                            CARDIAC CATHETERIZATION   [name] is an elderly woman from [place].  She presents with some  episodes of syncope and spells.  She had an echocardiogram which  revealed moderately reduced left ventricular systolic function.  She is  scheduled for heart catheterization based on these findings.   PROCEDURE:  Left heart catheterization with coronary angiography.  The  right femoral artery was easily cannulated using a modified Seldinger  technique.   HEMODYNAMIC RESULTS:  LV pressure 140/11 with an aortic pressure of  132/55.   ANGIOGRAPHY:  Left main:  The left main is smooth and normal and is  fairly large.   Left anterior descending artery has mild irregularities.  There is a 20-  30% stenosis in the mid LAD.  The remaining LAD has minor luminal  irregularities.   There is a large first diagonal artery that has minor luminal  irregularities, but no critical stenosis.  There is also a large septal  branch which is essentially normal.   The left circumflex vessel is a very large and normal vessel.  It has  minor luminal irregularities.  It gives off 2 obtuse marginal arteries  which are normal.   The right coronary artery is large and is dominant.  There is a 70%  stenosis in the mid/distal segment of the right coronary artery.  This  lesion does appear to alter the flow but still appears to be fairly  large lumen despite the 70% stenosis.  This lumen appears to be at least  2 mm in size through the lumen of the narrowed area.   The posterior descending artery and posterolateral segment artery are  normal.   The left ventriculogram was  performed in a 30-RAO position.  It reveals  global left ventricular hypokinesis.  The ejection fraction appears to  be 35-40% in this view.  There is some hypokinesis of the inferior wall  and mid anterior wall.  There is no significant mitral regurgitation.   COMPLICATIONS:  None.   CONCLUSIONS:  Single-vessel coronary artery disease involving the right  coronary artery.  This vessel is quite large and is not clear whether  this 70% RCA stenosis is actually causing an ischemia.  She is  relatively inactive.   We will plan on doing a stress Cardiolite study as an outpatient.  This  will help [place] to determine whether this is functionally important.      Vesta Mixer, M.D.  Electronically Signed     PJN/MEDQ  D:  10/15/2008  T:  10/16/2008  Job:  [id number]

## 2010-11-17 NOTE — Consult Note (Signed)
[name], DEETZ                ACCOUNT NO.:  [id number]   MEDICAL RECORD NO.:  [id number]          PATIENT TYPE:  INP   LOCATION:  4706                         FACILITY:  [facility]   PHYSICIAN:  Pramod P. Pearlean Brownie, MD    DATE OF BIRTH:  March 21, 1927   DATE OF CONSULTATION:  10/09/2008  DATE OF DISCHARGE:                                 CONSULTATION   REFERRING PHYSICIAN:  Steele Berg Phifer, MD   REASON FOR REFERRAL:  Episodes of syncope versus seizure.   HISTORY OF PRESENT ILLNESS:  [name] is a 77 year old Hispanic lady  who is unable to speak [place].  History is provided by 2 of her  daughters who are present at the bedside.  The patient has had multiple  episodes of brief loss of consciousness in the last 2 weeks.  Several of  these have been witnessed by her daughter who provides the history.  In  most of the episodes, the patient just falls down suddenly without  warning.  She does not have time to hold on to anything.  She has not  lost consciousness and she is just little dazed for a few seconds and  may be confused for a minute or so.  There is no preceding chest pain,  palpitations, sweating, or blurred vision.  In a couple of this  episodes, she has been found to have some jerky movements of her  extremities.  In one episode when the daughter was driving and her  mother was talking on the phone, she heard the phone drop and when she  turned, she found her mother to have jerky movement of the neck and the  right arm and looking up, but she quickly was able to respond to her and  was back to her baseline.  Another episode occurred on October 05, 2008, at  around 2:30 p.m. after the family noticed a loud sound and found mother  fallen on the floor with some rhythmic movements of upper extremities  with clenched teeth.  This lasted for 20-30 seconds and then she was  confused for about a minute or so and then back to her baseline quickly.  She has never had any focal weakness during  these episodes or complained  of headache.  She has undergone cardiac evaluation for the last couple  of days and was found to have a low ejection fraction of 35-40%, but the  cardiologists feel that is not enough to explain her symptoms.  The  patient has no known prior history of neurological problems.   PAST MEDICAL HISTORY:  Significant for:  1. Diabetes.  2. Hypertension.  3. Hyperlipidemia.   PAST SURGICAL HISTORY:  Right knee surgery.   MEDICATION LIST:  Januvia, gemfibrozil, glipizide, amlodipine, Plavix,  and tramadol.   REVIEW OF SYSTEMS:  Negative for recent fever, cough, chills, shortness  of breath, diarrhea, or illness.  Positive for loss of consciousness,  falls, and jerky movements.   PHYSICAL EXAMINATION:  GENERAL:  An obese, elderly Hispanic lady who is  currently not in distress.  VITAL SIGNS:  Afebrile, pulse  rate is 85 per minute and regular, blood  pressure 149/63, respiratory rate 20 per minute, and temperature 98.5.  HEENT:  Head is nontraumatic.  ENT exam unremarkable.  NECK:  Supple.  There is no bruit.  CARDIAC:  No murmur.  No gallop.  LUNGS:  Clear to auscultation.  NEUROLOGIC:  She is pleasant, awake, alert, and cooperative.  She  follows simple commands.  Eye movements are full range.  Face is  symmetric.  Tongue is midline.  Speech appears normal.  Motor system  exam reveals symmetric upper and lower extremity strength, tone,  reflexes, and coordination.  Gait was not tested.   LABORATORY DATA:  Reviewed, basic metabolic panel and CBC are normal.  CT scan of the head done on admission showed no acute abnormality.  Cardiology consult and echocardiogram findings were reviewed.   IMPRESSION:  A 77-year lady with multiple episodes of brief loss of  consciousness with brief involuntary movement during few of these  episodes.  The exact etiology is unclear but certainly possibilities include  idiopathic drop attacks of the elderly versus complex  partial seizures.  I doubt these are vertebrobasilar transient ischemic attacks given lack  of any focal neurological symptoms accompanying these.   PLAN:  I would recommend checking MRA of the neck to rule out any  significant for occlusive vertebrobasilar disease.  MRA of the brain has  already been done and not shown significant pathology or occlusive  disease.  The patient may need to repeat EEG at some point.  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  I discussed possible  side effects of Keppra with the patient's daughter and answered  questions.  She should follow up with me as an outpatient for further  recommendations.  Kindly call for questions.  I would also recommend  outpatient CardioNet monitoring for 3 weeks if okay with Cardiology.           ______________________________  Sunny Schlein. Pearlean Brownie, MD     PPS/MEDQ  D:  10/09/2008  T:  10/10/2008  Job:  [id number]

## 2010-11-17 NOTE — Procedures (Signed)
CLINICAL HISTORY:  The patient is an 77 year old Hispanic admitted October 05, 2008 for syncope.  She lacerated her scalp after a fall at home,  struck the back of her head below the occiput.  She is being evaluated  for possible new onset of seizures versus TIA and stroke.  Family  reports that she had rhythmic movements clenching her mouth, confusion  at the time of her fall.  She has a history of hypertension, diabetes  (780.2, 780.39).   PROCEDURE:  The tracing is carried out on a 32-channel digital Cadwell  recorder reformatted into 16-channel montages with one devoted to EKG.  The patient was awake during the recording and drowsy.  The  International 10/20 system lead placement was used.   MEDICATIONS:  Lantus, aspirin, Protonix, NovoLog, and Tylenol.   DESCRIPTION OF FINDINGS:  Dominant frequency is a 8-9-Hz-25 microvolt  activity.  This is associated with frontally predominant beta-range  activity.   The record begins with 7-Hz-25 microvolt activity.  Presumably, the  patient is drowsy at this time.  The patient drifts into natural sleep  with periods of arousal.  Sleep was characterized by mixed frequency  delta and lower theta range activity without vertex sharp waves or  spindles.   Toward the end of the record, an EKG artifact at A1 was seen.  There was  no focal slowing.  There was no interictal epileptiform activity in the  form of spikes or sharp waves.   IMPRESSION:  Normal record with the patient awake and drowsy.      Deanna Artis. Sharene Skeans, M.D.  Electronically Signed     EAV:WUJW  D:  10/07/2008 14:41:45  T:  10/08/2008 03:00:06  Job #:  [id number]   cc:   Tinnie Gens P. Weldon Inches, MD  Fax: [phone]

## 2010-11-17 NOTE — Consult Note (Signed)
[name], [name]                ACCOUNT NO.:  [id number]   MEDICAL RECORD NO.:  [id number]          PATIENT TYPE:  INP   LOCATION:  4706                         FACILITY:  [facility]   PHYSICIAN:  Vesta Mixer, M.D. DATE OF BIRTH:  11-Nov-1926   DATE OF CONSULTATION:  10/07/2008  DATE OF DISCHARGE:                                 CONSULTATION   [name] is a 77 year old female from [place].  We are asked to see  her by the Internal Medicine Service because of some episodes of syncope  and also because of left bundle-branch block.   The patient does not speak [place].  This severely limited our history  and review.   [name] is a 77 year old female.  She does not have any significant  cardiac problems.  She has recently started to having some episodes of  syncope.  These episodes of syncope come on suddenly and without  warning.  She typically wakes up fairly completely and does not have any  postictal-type state following these episodes, but the more recent  episodes have included more confusion where she seems to babble  according to her daughters.  These episodes occur at various times.  Some of them have been witnessed.  She had several while she has been  here on the floor on a telemetry monitor.   Several of these episodes have been consistent with orthostatic  hypotension.  One of the first episodes occurred after she had just  gotten out from a chair.  Another episode sounded more like a loss of  balance as she lost her balance at the top of the stairs.  She has  fallen and hit her head several different times.   The most recent episodes occurred the day of consultation.  She had  several episodes.  These were witnessed by the daughters and upon call  to the nursing station, there was no evidence of any sort of arrhythmia.   There is no bowel or bladder incontinence.  There is no confusion until  the past day or so.   She denies any chest pain or dyspnea.  She  had been able to do all other  normal activities without any significant problems.   She denies any weight gain or weight loss.  She denies any heat or cold  intolerance.  She denies any PND or orthopnea.  All other systems were  reviewed to the best of probability with the daughters and are negative.   CURRENT MEDICATIONS:  1. Aspirin 325 mg a day.  2. Protonix 40 mg a day.  3. NovoLog sliding scale.  4. Lantus insulin 5 units at night.  5. Subcu Lovenox.   ALLERGIES:  SHRIMP and CONTRAST ALLERGIES.   PAST MEDICAL HISTORY:  1. Diabetes mellitus type 2.  2. Hypertension.   SOCIAL HISTORY:  The patient does not smoke.  She is from [place].  She  does not speak [place].   FAMILY HISTORY:  Not obtainable.  Her daughters are relatively healthy  and there are no previous episodes in the family similar to this.  REVIEW OF SYSTEMS:  Reviewed in the HPI and all other systems are  negative.   PHYSICAL EXAMINATION:  GENERAL:  She is an elderly female in no acute  distress.  She is alert and oriented x3.  Her mood and affect are  normal.  HEENT:  Her sclerae are nonicteric.  Her mucous membranes are moist.  NECK:  Her carotids 2+ without bruits.  There is no JVD, no thyromegaly.  Her neck is supple.  LUNGS:  Clear.  BACK:  Nontender.  HEART:  Regular rate, S1 and S2.  Her PMI is nondisplaced.  Her ribs are  nontender.  ABDOMEN:  Good bowel sounds and is nontender.  She has no  hepatosplenomegaly.  There is no guarding or rebound.  EXTREMITIES:  She has no clubbing, cyanosis, or edema.  No skin rash or  nodules.  Her pulses are intact distally.  NEUROLOGIC:  Her gait was not assessed.  Cranial nerves II through XII  are intact, and her motor and sensory functions are intact.  VITAL SIGNS:  Heart rate 74 and blood pressure is 155/68.   Telemetry monitor reveals sinus bradycardia.  There were no pauses even  during her episodes of syncopal spells.   Her EKG was reviewed and  reveals normal sinus rhythm.  She has a left  bundle-branch block which is new compared to several years ago.   Her echocardiogram was reviewed.  She has severely depressed left  ventricular systolic function with an ejection fraction of around 20-  25%.   [name] presents with some episodes of syncope.  We have not been  able to find any cardiac cause of her syncope.  She certainly does not  have any arrhythmias to explain the syncope.   She does, however, have a moderate to severe left ventricular  dysfunction associated with left bundle-branch block.  We need to do a  heart catheterization on her.  She may be candidate for a biventricular  pacer or an ICD.  We have discussed the risks, benefits, and options of  heart catheterization through her daughters who acted as the  interpreter.  She understands and agrees to proceed.  We will schedule  this for Wednesday.   I have told them that I do not find any cardiac etiology for syncopal  episodes.  I do not think that has anything to do with her congestive  heart failure.      Vesta Mixer, M.D.  Electronically Signed     PJN/MEDQ  D:  10/08/2008  T:  10/09/2008  Job:  [id number]   cc:   Alvester Morin, M.D.

## 2010-11-17 NOTE — Discharge Summary (Signed)
[name], [name]                ACCOUNT NO.:  [id number]   MEDICAL RECORD NO.:  [id number]          PATIENT TYPE:  INP   LOCATION:  4706                         FACILITY:  [facility]   PHYSICIAN:  Fransisco Hertz, M.D.  DATE OF BIRTH:  [birth year]   DATE OF ADMISSION:  10/05/2008  DATE OF DISCHARGE:  10/10/2008                               DISCHARGE SUMMARY   DISCHARGE DIAGNOSES:  1. Syncopal episode of unknown etiology, status post fall and      laceration to her head.  2. Atypical rhythmic movements of upper extremities and brief episodes      of altered mental status, uncertain etiology, started empirically      on Keppra.  3. Nonischemic cardiomyopathy with left ventricular ejection fraction      by echo of 20-25% and an left ventricular ejection fraction of 35-      40% on heart catheterization.  4. A 50-60% occlusion of the right coronary artery.  5. Question of focal stenosis of the proximal left vertebral artery on      Neck MRI.  6. Diabetes mellitus type 2 with a hemoglobin A1c of 8.2.  7. Hypertension.  8. Knee arthritis.  9. Status post right breast excisional biopsy in May 2005 with      findings significant for intraductal papilloma.   DISCHARGE MEDICATIONS:  1. Keppra 250 mg p.o. b.i.d., prescription given.  2. Aspirin 81 mg p.o. daily, prescription given.  3. Zocor 10 mg p.o. daily, prescription given.  4. Norvasc 5 mg p.o. daily.  5. Gemfibrozil 600 mg p.o. b.i.d.  6. Ultram 37.5/32.5 p.o. b.i.d. p.r.n. pain.  7. Janumet 50/1000 p.o. b.i.d.   The patient was instructed to temporarily discontinue her benazepril  given the possibility of orthostasis related to her syncopal episodes  and she follow up with her PCP as to when to reinitiate the medication.  The patient was also told to discontinue her use of Plavix given that  there was no strong indication for this.   CONDITION ON DISCHARGE:  Up until the patient had begun on Keppra, she  had continued to have  these atypical movements along with altered mental  status for 30 seconds to a minute.  She had been evaluated both by a  neurologist and a cardiologist and at the time of discharge, no known  etiology for these movements or her syncope was determined.  The patient  was found to have cardiomyopathy with an ejection fraction of 20-25% by  2-D echo and had a subsequent cath which revealed a 60-70% RCA stenosis.  The patient is to follow up with Dr. Elease Hashimoto in the Outpatient Clinic for  an adenosine Myoview to evaluate whether this stenosis is causing  ischemia and whether PCI needs to be undertaken.  The patient was also  to go to Dr. Harvie Bridge office on the day of discharge or on the following  day to receive a cardiac heart monitor for continued evaluation for  potential arrhythmia contributing to the patient's syncope.  The patient  has had no arrhythmias while on telemetry during her entire  hospitalization numbering about 6 days.  The patient was also evaluated  in the hospital by Dr. Pearlean Brownie of Neurology and started on Keppra and  should follow up with Dr. Pearlean Brownie in his clinic.  Additionally, the  patient needs to follow up with her PCP.  It sounded like the patient  wanted to establish a new primary care doctor in [place] as the  current one in [place] makes for quite a long trip.  Given that the  patient has been seen at [facility], she is not a candidate to follow  up in our Outpatient Clinic and she did not like continue at  [facility].  When the patient does establish her PCP, her routine  medical care should be continued.   CONSULTATIONS:  1. Pramod P. Pearlean Brownie, MD, of Neurology.  2. Vesta Mixer, MD, of Cardiology.   PROCEDURES:  1. Left-sided heart catheterization:  60-70% occlusion of the RCA was      demonstrated.  The LVEF was estimated to be around 35-40% which was      higher than the echo estimate.  2. A 2-D echo on October 07, 2008, with the following  interpretation:      Overall left ventricular systolic function was severely reduced.      Left ventricular ejection fraction was estimated to be between 20-      25%.  There was dyskinesis of the entire anteroseptal wall and      akinesis of the inferoposterior wall from mid to apex.  It was      difficult to visualize the left segment.  Left ventricular wall      thickness was mildly increased.  3. CT of the head and spine was notable for no acute intracranial or      cervical spine abnormalities or fractures.  There was noted to be      diffuse thickening of the anterolateral skull, consistent with      hyperostosis frontalis interna.  4. MRI/MRA of the neck and head was notable for atrophy and small      vessel disease.  No acute intracranial findings.  No visible      stenosis on the MRA of the head.  There was, however, finding of      questionable focal stenosis of the proximal left vertebral artery      on the MRA of the neck performed subsequently on October 10, 2008.  5. EEG performed on October 07, 2008, with normal record with the patient      weak and drowsy.   HISTORY OF PRESENT ILLNESS:  The patient is a 77 year old Hispanic  female with hypertension and diabetes mellitus type 2, who presents  after a fall around 2:30 in the afternoon on the day of admission while  walking with resultant trauma and laceration the back of her head.  Family rushed to the room and witnessed clenched hands bilaterally and  slow rhythmic movements of upper extremities and clenched mouth.  Episode lasted about 30 seconds.  The patient had subsequently 1 minute  of confusion and then came to.  Denies preceding visual symptoms,  headache, chest pain, or shortness breath.  No incontinence or tongue  biting.  Family recalled around 7 total episodes like this over the  preceding 2 weeks with similar symptoms.  The patient recently was seen  by PCP and started on Plavix with the first dose being yesterday  to  ensure blood flow to the brain per family.  The  patient had recently  been accidentally taking effectively 2 times dose of Norvasc and  benazepril.   PHYSICAL EXAMINATION:  VITAL SIGNS:  Temperature 98, blood pressure  140/69, pulse 76, respiratory rate of 18, and O2 sat of 96% on room air.  GENERAL:  No apparent distress, lying in bed.  EYES:  EOMI, PERRL.  Arcus senilis.  ENT:  3-4 cm horizontal laceration on back of head status post staples.  NECK:  No bruits.  CARDIOVASCULAR:  2/6 systolic ejection murmur over the left side second  intercostal space with no radiation.  Regular rate and rhythm.  No rubs  or gallops.  Normal S1 and S2.  GI:  Normoactive bowel sounds, nontender, and nondistended.  EXTREMITIES.  Ecchymoses throughout left leg from knee to the ankle,  status post previous fall.  GU:  No CVA tenderness.  NEURO:  Alert and oriented.  Cranial nerves II-XII fully intact.  Cerebellar function intact.  5/5 bilateral upper and bilateral lower  extremity strength.  Gait not assessed secondary to the patient being in  bed after the injury.  Of note, during the exam, the patient had an  episode of bilateral upper extremity shaking for about 15-20 seconds and  unable to recall the events afterwards and was little bit groggy.  PSYCH:  Appropriate.   INITIAL LABORATORY DATA:  Sodium of 137, potassium of 4.5, chloride of  104, bicarbonate 24, BUN of 21, creatinine of  0.64, glucose of 112.  White blood cell count of 9.0, hemoglobin of 12.5, platelets of 242, and  ANC of 5.7.  LFTs within normal limits.   EKG was significant for left bundle-branch block, but no signs of  ischemia.  Cardiac enzymes were cycled and were normal x3.   HOSPITAL COURSE:  1. Syncopal episode:  Given the episode of syncope and the clinical      sequence of events, a cardiac etiology was explored.  The patient      did have a left bundle-branch block on EKG without any overt signs      of ischemia.   An echo was performed which showed 20-25% ejection      fraction and the patient was monitored on telemetry without any      episodes of arrhythmias.  Cardiology was asked to evaluate the      patient and Dr. Elease Hashimoto came to see the patient and decided to      perform a heart catheterization which showed a 60-70% RCA occlusion      with left ventricular dysfunction that was seen on echo.  It was      not determined whether this 60-70% RCA occlusion was contributing      to myocardial ischemia, thus the patient is set to follow up with      Dr. Elease Hashimoto in his Outpatient Clinic to have a Myoview and potential      PCI depending on those results.  The patient was also set up for      cardiac monitor upon discharge as her symptoms may still be related      to an arrhythmia, but no arrhythmias were seen during the patient's      6-day hospitalization, all of which time the patient was on      telemetry.  Other considerations for syncope were explored with an      MRI/MRA of the brain showing no acute intracranial findings.  There      was the finding of left  proximal vertebral artery stenosis on the      MRA, but this was a questionable finding and would be unlikely to      contribute to the patient's presentation.  Along with the syncopal      episodes, the patient has also been experiencing somewhat frequent      bilateral upper extremity rhythmic arm movements with subsequent      altered mental status.  For this reason, Dr. Pearlean Brownie of Neurology was      asked to evaluate the patient and decided that the patient's      episodes were atypical for seizure, but may be consistent enough      warranting an empiric trial of Keppra which the patient was started      on the hospital.  She will follow up with Dr. Pearlean Brownie in the      Outpatient Clinic to evaluate whether this Keppra has been working      and whether she should continue on this as an outpatient.  Other      considerations for the patient's  syncopal episodes include      orthostasis and the patient was found to be borderline orthostatic      upon admission with a systolic blood pressure that dropped about 15      and heart rate that went up about 15 on standing.  For this reason,      her benazepril and Norvasc were held throughout the      hospitalization.  She will be returned to her Norvasc upon      discharge.  She should follow up as an outpatient to see when the      benazepril should be resumed.  There are no signs of vertigo that      could be contributing to the patient's episodes.  Disequilibrium      was always a consideration and the patient is a 77 year old and      somewhat unstable on her feet.  She was provided with a      prescription for walker to help ensure she has balance or support      while walking.  2. Coronary artery disease with 60-70% RCA stenosis by cath:  As noted      above, the patient had an echo with the findings of cardiomyopathy      and was thus taken to Heart Catheterization Lab and was found to      have 60-70% occlusion.  She was discharged on aspirin as well as      statin and will follow up with Dr. Elease Hashimoto of Cardiology for further      workup to include an adenosine and possible PCI.  The patient's      risk factors include hypertension, diabetes, but she has no smoking      history, just has borderline hyperlipidemia.  3. Diabetes mellitus:  The patient was started on Lantus and sliding      scale insulin during her hospitalization and her blood sugars ran a      little bit high, but with titration of her Lantus up she returned      to a pretty good level.  Her hemoglobin A1c was a little bit higher      than desired at 8.2, but she was returned to her home medications      of glipizide and Janumet and should follow up with PCP.  She may  need to go on insulin given that she has not done ideally with her      oral medications.  Her blood sugar when she was found by EMS and       throughout the hospitalization was never low, so it is unlikely the      hypoglycemia was contributing to her episodes.  4. Hypertension:  The patient was admitted on Norvasc and benazepril      and these were held secondary to consideration for orthostatic      hypotension.  The patient's blood pressure was fairly stable      usually in 140s as high as in the 150s and was 129/46 upon      discharge when her Norvasc was reinstituted at 5 mg daily.  Again,      her benazepril was held secondary to consideration of orthostatic      hypotension.  5. Head laceration:  The patient had about a 3-4 cm horizontal      laceration on the back of her head and had staples applied in the      ED.  Upon discharge, the staples were still in there because it has      only been about 5 days since she was admitted.  She should have      them removed after about 2 weeks and can have this done either with      Dr. Elease Hashimoto, Dr. Pearlean Brownie, or her PCP if she is able to find one or she      can return to the ED.  This was explained to the patient prior to      discharge.   DISCHARGE VITAL SIGNS:  Temperature of 97.3, pulse is 77, blood pressure  129/46, and O2 sat 97% on room air.   DISCHARGE LABORATORY DATA:  Sodium of 138, potassium of 4.1, chloride of  101, bicarb of 26, glucose of 213, BUN of 27, creatinine of 0.73.  CBC  as follows:  White blood cell count 13.8, hemoglobin 11.6, and platelet  233.   PENDING LABORATORY DATA:  No pending labs at this time.      Linward Foster, MD  Electronically Signed      Fransisco Hertz, M.D.  Electronically Signed    LW/MEDQ  D:  10/11/2008  T:  10/11/2008  Job:  [id number]   cc:   Vesta Mixer, M.D.  Pramod P. Pearlean Brownie, MD  Jacklynn Lewis Steva Ready

## 2010-11-20 NOTE — Op Note (Signed)
NAMERENE, GONSOULIN                            ACCOUNT NO.:  [id number]   MEDICAL RECORD NO.:  [id number]                   PATIENT TYPE:  AMB   LOCATION:  DSC                                  FACILITY:  [facility]   PHYSICIAN:  Rose Phi. Young, M.D.                DATE OF BIRTH:  1926/08/16   DATE OF PROCEDURE:  11/11/2003  DATE OF DISCHARGE:                                 OPERATIVE REPORT   PREOPERATIVE DIAGNOSIS:  Abnormal right breast mammogram.   POSTOPERATIVE DIAGNOSIS:  Abnormal right breast mammogram.   OPERATION:  Right breast biopsy with needle localization and specimen  mammogram.   SURGEON:  Dr. Francina Ames.   ANESTHESIA:  MAC.   OPERATIVE PROCEDURE:  Prior to coming to the operating room, two localizing  wires were placed to try to bracket the area of microcalcifications.   The patient was placed on the operating table with arms extended on the arm  board.  After prepping and draping, I outlined a radial incision which would  connect the two wires which were in the medial lower quadrant of the right  breast.  The area was then thoroughly infiltrated with a local anesthetic.  Incision was then made and a wide excision of both wires and the surrounding  tissue including the tissue underneath the X marked on the skin was carried  out.  Hemostasis obtained with the cautery.  Specimen mammogram confirmed  the removal of the abnormal area.   The wound was closed with a subcuticular 4-0 Monocryl and Steri-Strips.  Dressing applied.  The patient transferred to recovery room in satisfactory  condition having tolerated the procedure well.                                               Rose Phi. Maple Hudson, M.D.    PRY/MEDQ  D:  11/11/2003  T:  11/11/2003  Job:  [id number]

## 2011-01-05 ENCOUNTER — Encounter: Payer: Self-pay | Admitting: Internal Medicine

## 2011-01-05 ENCOUNTER — Other Ambulatory Visit: Payer: Self-pay | Admitting: Internal Medicine

## 2011-01-05 DIAGNOSIS — R911 Solitary pulmonary nodule: Secondary | ICD-10-CM

## 2011-02-13 ENCOUNTER — Inpatient Hospital Stay (INDEPENDENT_AMBULATORY_CARE_PROVIDER_SITE_OTHER)
Admission: RE | Admit: 2011-02-13 | Discharge: 2011-02-13 | Disposition: A | Discharge status: Home / Self Care | Payer: Medicare Other | Source: Ambulatory Visit | Attending: Emergency Medicine | Admitting: Emergency Medicine

## 2011-02-13 ENCOUNTER — Emergency Department (HOSPITAL_COMMUNITY): Payer: Medicare Other

## 2011-02-13 ENCOUNTER — Emergency Department (HOSPITAL_COMMUNITY)
Admission: EM | Admit: 2011-02-13 | Discharge: 2011-02-13 | Disposition: A | Discharge status: Home / Self Care | Payer: Medicare Other | Attending: Emergency Medicine | Admitting: Emergency Medicine

## 2011-02-13 DIAGNOSIS — F3289 Other specified depressive episodes: Secondary | ICD-10-CM | POA: Insufficient documentation

## 2011-02-13 DIAGNOSIS — R42 Dizziness and giddiness: Secondary | ICD-10-CM

## 2011-02-13 DIAGNOSIS — Z950 Presence of cardiac pacemaker: Secondary | ICD-10-CM | POA: Insufficient documentation

## 2011-02-13 DIAGNOSIS — R197 Diarrhea, unspecified: Secondary | ICD-10-CM | POA: Insufficient documentation

## 2011-02-13 DIAGNOSIS — R112 Nausea with vomiting, unspecified: Secondary | ICD-10-CM | POA: Insufficient documentation

## 2011-02-13 DIAGNOSIS — I10 Essential (primary) hypertension: Secondary | ICD-10-CM

## 2011-02-13 DIAGNOSIS — G40909 Epilepsy, unspecified, not intractable, without status epilepticus: Secondary | ICD-10-CM | POA: Insufficient documentation

## 2011-02-13 DIAGNOSIS — Z79899 Other long term (current) drug therapy: Secondary | ICD-10-CM | POA: Insufficient documentation

## 2011-02-13 DIAGNOSIS — F329 Major depressive disorder, single episode, unspecified: Secondary | ICD-10-CM | POA: Insufficient documentation

## 2011-02-13 LAB — COMPREHENSIVE METABOLIC PANEL
AST: 26 U/L (ref 0–37)
Albumin: 3.5 g/dL (ref 3.5–5.2)
Alkaline Phosphatase: 125 U/L — ABNORMAL HIGH (ref 39–117)
Chloride: 99 mEq/L (ref 96–112)
Potassium: 4.2 mEq/L (ref 3.5–5.1)
Total Bilirubin: 0.2 mg/dL — ABNORMAL LOW (ref 0.3–1.2)

## 2011-02-13 LAB — POCT I-STAT TROPONIN I: Troponin i, poc: 0.08 ng/mL (ref 0.00–0.08)

## 2011-02-13 LAB — CBC
HCT: 43.1 % (ref 36.0–46.0)
Platelets: 195 10*3/uL (ref 150–400)
RDW: 15.3 % (ref 11.5–15.5)
WBC: 10.2 10*3/uL (ref 4.0–10.5)

## 2011-02-13 LAB — DIFFERENTIAL
Basophils Absolute: 0 10*3/uL (ref 0.0–0.1)
Eosinophils Relative: 1 % (ref 0–5)
Lymphocytes Relative: 18 % (ref 12–46)
Neutrophils Relative %: 74 % (ref 43–77)

## 2011-02-13 LAB — CARBAMAZEPINE LEVEL, TOTAL: Carbamazepine Lvl: 5.8 ug/mL (ref 4.0–12.0)

## 2011-07-20 DIAGNOSIS — I428 Other cardiomyopathies: Secondary | ICD-10-CM | POA: Diagnosis not present

## 2011-07-20 DIAGNOSIS — Z4502 Encounter for adjustment and management of automatic implantable cardiac defibrillator: Secondary | ICD-10-CM | POA: Diagnosis not present

## 2011-07-22 DIAGNOSIS — I509 Heart failure, unspecified: Secondary | ICD-10-CM | POA: Diagnosis not present

## 2011-07-22 DIAGNOSIS — E785 Hyperlipidemia, unspecified: Secondary | ICD-10-CM | POA: Diagnosis not present

## 2011-07-22 DIAGNOSIS — I10 Essential (primary) hypertension: Secondary | ICD-10-CM | POA: Diagnosis not present

## 2011-09-23 ENCOUNTER — Encounter: Payer: Self-pay | Admitting: *Deleted

## 2011-11-16 DIAGNOSIS — I428 Other cardiomyopathies: Secondary | ICD-10-CM | POA: Diagnosis not present

## 2011-11-16 DIAGNOSIS — Z4502 Encounter for adjustment and management of automatic implantable cardiac defibrillator: Secondary | ICD-10-CM | POA: Diagnosis not present

## 2011-12-11 ENCOUNTER — Emergency Department (INDEPENDENT_AMBULATORY_CARE_PROVIDER_SITE_OTHER): Payer: Medicare Other

## 2011-12-11 ENCOUNTER — Emergency Department (INDEPENDENT_AMBULATORY_CARE_PROVIDER_SITE_OTHER)
Admission: EM | Admit: 2011-12-11 | Discharge: 2011-12-11 | Disposition: A | Discharge status: Home / Self Care | Payer: Medicare Other | Source: Home / Self Care | Attending: Emergency Medicine | Admitting: Emergency Medicine

## 2011-12-11 ENCOUNTER — Encounter (HOSPITAL_COMMUNITY): Payer: Self-pay | Admitting: *Deleted

## 2011-12-11 DIAGNOSIS — S42309A Unspecified fracture of shaft of humerus, unspecified arm, initial encounter for closed fracture: Secondary | ICD-10-CM

## 2011-12-11 HISTORY — DX: Unspecified fall, initial encounter: W19.XXXA

## 2011-12-11 NOTE — ED Notes (Signed)
Dr. Gramig paged. 

## 2011-12-11 NOTE — ED Notes (Signed)
Per daughter pt fell and initially injured left upper arm  February  - has been seeing dr Amanda Pea last visit was in   March  - pt fell 3 days ago reinjured left upper arm - deformity present pt has not used arm since fall - pt speaks spanish daughter interpreting for her

## 2011-12-11 NOTE — Discharge Instructions (Signed)
Spoke with Dr. Amanda Pea in regards to xray.  Would like for you to see him in his office within the week.  Tylenol as needed for pain.

## 2011-12-11 NOTE — ED Provider Notes (Signed)
History     CSN: [id number]  Arrival date & time 12/11/11  1310   First MD Initiated Contact with Patient 12/11/11 1313      Chief Complaint  Patient presents with  . Fall  . Arm Pain  . Arm Injury    (Consider location/radiation/quality/duration/timing/severity/associated sxs/prior treatment) The history is provided by the patient and a relative. The history is limited by a language barrier. A language interpreter was used.   Kalis Friese is a 78 y.o. female who sustained a left arm injury 3 days ago. Mechanism of injury: slipped while walking landing on left side.   Immediate symptoms: denies immediate pain. Symptoms have been unchanged since that time. Prior history of related problems: patient injured same arm that resulted in a humerus fracture approx one year ago and has been seen by ortho for management, no surgery.  Has not taken any thing for pain, denies pain.     Past Medical History  Diagnosis Date  . Syncope   . CAD (coronary artery disease)   . SOB (shortness of breath)   . Edema   . DM2 (diabetes mellitus, type 2)   . HTN (hypertension)   . Fall     Past Surgical History  Procedure Date  . Back surgery   . Eye surgery     History reviewed. No pertinent family history.  History  Substance Use Topics  . Smoking status: Never Smoker   . Smokeless tobacco: Not on file  . Alcohol Use: No    OB History    Grav Para Term Preterm Abortions TAB SAB Ect Mult Living                  Review of Systems  All other systems reviewed and are negative.    Allergies  Review of patient's allergies indicates no known allergies.  Home Medications   Current Outpatient Rx  Name Route Sig Dispense Refill  . AMLODIPINE BESYLATE 5 MG PO TABS Oral Take 2.5 mg by mouth 3 (three) times daily.     . ASPIRIN 81 MG PO TABS Oral Take 81 mg by mouth daily.    . ATORVASTATIN CALCIUM 20 MG PO TABS Oral Take 20 mg by mouth daily.    Marland Kitchen CARBATROL PO Oral Take by mouth 2  (two) times daily.    Marland Kitchen FLUOXETINE HCL 20 MG PO CAPS Oral Take 20 mg by mouth daily.    Marland Kitchen LISINOPRIL 20 MG PO TABS Oral Take 20 mg by mouth daily.    Marland Kitchen PIOGLITAZONE HCL-GLIMEPIRIDE 30-2 MG PO TABS Oral Take 1 tablet by mouth daily with breakfast.    . FORTEO [place] Subcutaneous Inject 20 mcg into the skin.    Marland Kitchen TRAMADOL HCL 50 MG PO TABS Oral Take 50 mg by mouth every 6 (six) hours as needed.    Marland Kitchen GEMFIBROZIL 600 MG PO TABS Oral Take 600 mg by mouth 2 (two) times daily before a meal.    . SIMVASTATIN 10 MG PO TABS Oral Take 10 mg by mouth at bedtime.    Marland Kitchen SITAGLIPTIN-METFORMIN HCL 50-1000 MG PO TABS Oral Take 1 tablet by mouth 2 (two) times daily with a meal.      BP 175/69  Pulse 70  Temp(Src) 98.5 F (36.9 C) (Oral)  Resp 16  SpO2 96%  Physical Exam  Nursing note and vitals reviewed. Constitutional: She is oriented to person, place, and time. Vital signs are normal. She appears well-developed and well-nourished. She  is active and cooperative.  HENT:  Head: Normocephalic.  Eyes: Conjunctivae are normal. Pupils are equal, round, and reactive to light. No scleral icterus.  Neck: Trachea normal. Neck supple.  Cardiovascular: Normal rate, regular rhythm, normal heart sounds and normal pulses.   Pulmonary/Chest: Effort normal and breath sounds normal.  Musculoskeletal:       Right shoulder: Normal.       Left shoulder: She exhibits tenderness.       Left elbow: Normal.       Left upper arm: She exhibits deformity.       Tenderness at [facility], obvious left humerus deformity, no bruising, hematoma, tenderness with palpation, decreased ROM at shoulder but no change in ROM at elbow and wrist, pulses intact, no obvious distress with AROM  Neurological: She is alert and oriented to person, place, and time. She has normal strength. No cranial nerve deficit or sensory deficit. GCS eye subscore is 4. GCS verbal subscore is 5. GCS motor subscore is 6.       Equal hand grips  Skin: Skin is warm, dry and  intact.  Psychiatric: She has a normal mood and affect. Her speech is normal and behavior is normal. Judgment and thought content normal. Cognition and memory are normal.    ED Course  Procedures (including critical care time)  Labs Reviewed - No data to display Dg Humerus Left  12/11/2011  *RADIOLOGY REPORT*  Clinical Data: Larey Seat 3 days ago and injured upper arm.  LEFT HUMERUS - 2+ VIEW  Comparison: 08/14/2010  Findings: Single AP view.  The arm demonstrates an angulated displaced left humeral diaphyseal fracture with overriding fracture fragments which represents a chronic nonunion.  Similar angulation and displacement was seen in 2012.  There is no definite acute fracture.  Skeletal osteopenia is suggested. Mild AC joint widening is chronic.  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  Original Report Authenticated By: Elsie Stain, M.D.     1. Humerus fracture       MDM  16:14 Spoke with Dr. Amanda Pea concerning patient and radiological findings, pt poor surgical candidate and given that injury is not considered new with no c/o of significant pain pt can follow up outpatient in office next week. Sling for comfort, advised to not use more than a couple hours/day.        Johnsie Kindred, NP 12/11/11 2102

## 2011-12-12 NOTE — ED Provider Notes (Signed)
Medical screening examination/treatment/procedure(s) were performed by non-physician practitioner and as supervising physician I was immediately available for consultation/collaboration.  Chania Kochanski   Taneil Lazarus, MD 12/12/11 1037 

## 2011-12-15 DIAGNOSIS — H4010X0 Unspecified open-angle glaucoma, stage unspecified: Secondary | ICD-10-CM | POA: Diagnosis not present

## 2011-12-20 DIAGNOSIS — E7800 Pure hypercholesterolemia, unspecified: Secondary | ICD-10-CM | POA: Diagnosis not present

## 2011-12-20 DIAGNOSIS — E119 Type 2 diabetes mellitus without complications: Secondary | ICD-10-CM | POA: Diagnosis not present

## 2011-12-20 DIAGNOSIS — S42309A Unspecified fracture of shaft of humerus, unspecified arm, initial encounter for closed fracture: Secondary | ICD-10-CM | POA: Diagnosis not present

## 2011-12-20 DIAGNOSIS — I10 Essential (primary) hypertension: Secondary | ICD-10-CM | POA: Diagnosis not present

## 2011-12-22 DIAGNOSIS — E7800 Pure hypercholesterolemia, unspecified: Secondary | ICD-10-CM | POA: Diagnosis not present

## 2011-12-22 DIAGNOSIS — I10 Essential (primary) hypertension: Secondary | ICD-10-CM | POA: Diagnosis not present

## 2011-12-22 DIAGNOSIS — E1365 Other specified diabetes mellitus with hyperglycemia: Secondary | ICD-10-CM | POA: Diagnosis not present

## 2012-01-21 DIAGNOSIS — I10 Essential (primary) hypertension: Secondary | ICD-10-CM | POA: Diagnosis not present

## 2012-02-17 DIAGNOSIS — I10 Essential (primary) hypertension: Secondary | ICD-10-CM | POA: Diagnosis not present

## 2012-02-25 DIAGNOSIS — H4010X0 Unspecified open-angle glaucoma, stage unspecified: Secondary | ICD-10-CM | POA: Diagnosis not present

## 2012-03-16 DIAGNOSIS — E119 Type 2 diabetes mellitus without complications: Secondary | ICD-10-CM | POA: Diagnosis not present

## 2012-03-16 DIAGNOSIS — E669 Obesity, unspecified: Secondary | ICD-10-CM | POA: Diagnosis not present

## 2012-03-16 DIAGNOSIS — I10 Essential (primary) hypertension: Secondary | ICD-10-CM | POA: Diagnosis not present

## 2012-03-21 DIAGNOSIS — I428 Other cardiomyopathies: Secondary | ICD-10-CM | POA: Diagnosis not present

## 2012-04-06 DIAGNOSIS — R197 Diarrhea, unspecified: Secondary | ICD-10-CM | POA: Diagnosis not present

## 2012-04-06 DIAGNOSIS — I10 Essential (primary) hypertension: Secondary | ICD-10-CM | POA: Diagnosis not present

## 2012-04-06 DIAGNOSIS — R42 Dizziness and giddiness: Secondary | ICD-10-CM | POA: Diagnosis not present

## 2012-04-06 DIAGNOSIS — R112 Nausea with vomiting, unspecified: Secondary | ICD-10-CM | POA: Diagnosis not present

## 2012-04-19 DIAGNOSIS — I10 Essential (primary) hypertension: Secondary | ICD-10-CM | POA: Diagnosis not present

## 2012-05-08 DIAGNOSIS — IMO0002 Reserved for concepts with insufficient information to code with codable children: Secondary | ICD-10-CM | POA: Diagnosis not present

## 2012-05-08 DIAGNOSIS — M19019 Primary osteoarthritis, unspecified shoulder: Secondary | ICD-10-CM | POA: Diagnosis not present

## 2012-05-08 DIAGNOSIS — S42309A Unspecified fracture of shaft of humerus, unspecified arm, initial encounter for closed fracture: Secondary | ICD-10-CM | POA: Diagnosis not present

## 2012-06-12 DIAGNOSIS — E119 Type 2 diabetes mellitus without complications: Secondary | ICD-10-CM | POA: Diagnosis not present

## 2012-06-12 DIAGNOSIS — E669 Obesity, unspecified: Secondary | ICD-10-CM | POA: Diagnosis not present

## 2012-06-12 DIAGNOSIS — I10 Essential (primary) hypertension: Secondary | ICD-10-CM | POA: Diagnosis not present

## 2012-06-15 DIAGNOSIS — E039 Hypothyroidism, unspecified: Secondary | ICD-10-CM | POA: Diagnosis not present

## 2012-06-15 DIAGNOSIS — E119 Type 2 diabetes mellitus without complications: Secondary | ICD-10-CM | POA: Diagnosis not present

## 2012-07-20 DIAGNOSIS — I509 Heart failure, unspecified: Secondary | ICD-10-CM | POA: Diagnosis not present

## 2012-07-20 DIAGNOSIS — R9431 Abnormal electrocardiogram [ECG] [EKG]: Secondary | ICD-10-CM | POA: Diagnosis not present

## 2012-07-20 DIAGNOSIS — Z95810 Presence of automatic (implantable) cardiac defibrillator: Secondary | ICD-10-CM | POA: Diagnosis not present

## 2012-07-25 DIAGNOSIS — I428 Other cardiomyopathies: Secondary | ICD-10-CM | POA: Diagnosis not present

## 2012-07-25 DIAGNOSIS — IMO0002 Reserved for concepts with insufficient information to code with codable children: Secondary | ICD-10-CM | POA: Diagnosis not present

## 2012-07-25 DIAGNOSIS — S42309A Unspecified fracture of shaft of humerus, unspecified arm, initial encounter for closed fracture: Secondary | ICD-10-CM | POA: Diagnosis not present

## 2012-07-25 DIAGNOSIS — I509 Heart failure, unspecified: Secondary | ICD-10-CM | POA: Diagnosis not present

## 2012-08-24 DIAGNOSIS — H4010X0 Unspecified open-angle glaucoma, stage unspecified: Secondary | ICD-10-CM | POA: Diagnosis not present

## 2012-09-13 DIAGNOSIS — M25519 Pain in unspecified shoulder: Secondary | ICD-10-CM | POA: Diagnosis not present

## 2012-09-13 DIAGNOSIS — E039 Hypothyroidism, unspecified: Secondary | ICD-10-CM | POA: Diagnosis not present

## 2012-09-13 DIAGNOSIS — E119 Type 2 diabetes mellitus without complications: Secondary | ICD-10-CM | POA: Diagnosis not present

## 2012-11-29 ENCOUNTER — Encounter: Payer: Self-pay | Admitting: Cardiovascular Disease

## 2012-12-07 DIAGNOSIS — E119 Type 2 diabetes mellitus without complications: Secondary | ICD-10-CM | POA: Diagnosis not present

## 2012-12-07 DIAGNOSIS — E039 Hypothyroidism, unspecified: Secondary | ICD-10-CM | POA: Diagnosis not present

## 2012-12-25 DIAGNOSIS — E119 Type 2 diabetes mellitus without complications: Secondary | ICD-10-CM | POA: Diagnosis not present

## 2012-12-25 DIAGNOSIS — I10 Essential (primary) hypertension: Secondary | ICD-10-CM | POA: Diagnosis not present

## 2012-12-25 DIAGNOSIS — E039 Hypothyroidism, unspecified: Secondary | ICD-10-CM | POA: Diagnosis not present

## 2012-12-25 DIAGNOSIS — E669 Obesity, unspecified: Secondary | ICD-10-CM | POA: Diagnosis not present

## 2013-02-22 DIAGNOSIS — H4010X0 Unspecified open-angle glaucoma, stage unspecified: Secondary | ICD-10-CM | POA: Diagnosis not present

## 2013-03-20 DIAGNOSIS — E039 Hypothyroidism, unspecified: Secondary | ICD-10-CM | POA: Diagnosis not present

## 2013-03-20 DIAGNOSIS — E119 Type 2 diabetes mellitus without complications: Secondary | ICD-10-CM | POA: Diagnosis not present

## 2013-03-20 DIAGNOSIS — I10 Essential (primary) hypertension: Secondary | ICD-10-CM | POA: Diagnosis not present

## 2013-03-27 DIAGNOSIS — I428 Other cardiomyopathies: Secondary | ICD-10-CM | POA: Diagnosis not present

## 2013-03-27 DIAGNOSIS — E119 Type 2 diabetes mellitus without complications: Secondary | ICD-10-CM | POA: Diagnosis not present

## 2013-03-27 DIAGNOSIS — E039 Hypothyroidism, unspecified: Secondary | ICD-10-CM | POA: Diagnosis not present

## 2013-04-09 ENCOUNTER — Other Ambulatory Visit: Payer: Self-pay

## 2013-04-09 DIAGNOSIS — Z1231 Encounter for screening mammogram for malignant neoplasm of breast: Secondary | ICD-10-CM

## 2013-04-17 ENCOUNTER — Ambulatory Visit: Payer: Medicare Other

## 2013-05-03 ENCOUNTER — Ambulatory Visit
Admission: RE | Admit: 2013-05-03 | Discharge: 2013-05-03 | Disposition: A | Discharge status: Home / Self Care | Payer: Medicare Other | Source: Ambulatory Visit

## 2013-05-03 DIAGNOSIS — Z1231 Encounter for screening mammogram for malignant neoplasm of breast: Secondary | ICD-10-CM

## 2013-07-03 DIAGNOSIS — I10 Essential (primary) hypertension: Secondary | ICD-10-CM | POA: Diagnosis not present

## 2013-07-03 DIAGNOSIS — E039 Hypothyroidism, unspecified: Secondary | ICD-10-CM | POA: Diagnosis not present

## 2013-07-03 DIAGNOSIS — E119 Type 2 diabetes mellitus without complications: Secondary | ICD-10-CM | POA: Diagnosis not present

## 2013-07-04 DIAGNOSIS — E039 Hypothyroidism, unspecified: Secondary | ICD-10-CM | POA: Diagnosis not present

## 2013-07-04 DIAGNOSIS — E119 Type 2 diabetes mellitus without complications: Secondary | ICD-10-CM | POA: Diagnosis not present

## 2013-07-19 DIAGNOSIS — I472 Ventricular tachycardia: Secondary | ICD-10-CM | POA: Diagnosis not present

## 2013-07-19 DIAGNOSIS — I5022 Chronic systolic (congestive) heart failure: Secondary | ICD-10-CM | POA: Diagnosis not present

## 2013-07-19 DIAGNOSIS — I509 Heart failure, unspecified: Secondary | ICD-10-CM | POA: Diagnosis not present

## 2013-07-19 DIAGNOSIS — R9431 Abnormal electrocardiogram [ECG] [EKG]: Secondary | ICD-10-CM | POA: Diagnosis not present

## 2013-07-19 DIAGNOSIS — I4729 Other ventricular tachycardia: Secondary | ICD-10-CM | POA: Diagnosis not present

## 2013-08-02 DIAGNOSIS — Z4502 Encounter for adjustment and management of automatic implantable cardiac defibrillator: Secondary | ICD-10-CM | POA: Diagnosis not present

## 2013-08-02 DIAGNOSIS — I428 Other cardiomyopathies: Secondary | ICD-10-CM | POA: Diagnosis not present

## 2013-08-02 DIAGNOSIS — Z95810 Presence of automatic (implantable) cardiac defibrillator: Secondary | ICD-10-CM | POA: Diagnosis not present

## 2013-08-04 ENCOUNTER — Emergency Department (HOSPITAL_COMMUNITY)
Admission: EM | Admit: 2013-08-04 | Discharge: 2013-08-04 | Disposition: A | Discharge status: Home / Self Care | Payer: Medicare Other | Attending: Emergency Medicine | Admitting: Emergency Medicine

## 2013-08-04 ENCOUNTER — Emergency Department (HOSPITAL_COMMUNITY): Payer: Medicare Other

## 2013-08-04 ENCOUNTER — Encounter (HOSPITAL_COMMUNITY): Payer: Self-pay | Admitting: Emergency Medicine

## 2013-08-04 DIAGNOSIS — Z7982 Long term (current) use of aspirin: Secondary | ICD-10-CM | POA: Diagnosis not present

## 2013-08-04 DIAGNOSIS — R42 Dizziness and giddiness: Secondary | ICD-10-CM | POA: Insufficient documentation

## 2013-08-04 DIAGNOSIS — S1093XA Contusion of unspecified part of neck, initial encounter: Secondary | ICD-10-CM | POA: Diagnosis not present

## 2013-08-04 DIAGNOSIS — I10 Essential (primary) hypertension: Secondary | ICD-10-CM | POA: Diagnosis not present

## 2013-08-04 DIAGNOSIS — N390 Urinary tract infection, site not specified: Secondary | ICD-10-CM | POA: Diagnosis not present

## 2013-08-04 DIAGNOSIS — S0993XA Unspecified injury of face, initial encounter: Secondary | ICD-10-CM | POA: Diagnosis not present

## 2013-08-04 DIAGNOSIS — I2510 Atherosclerotic heart disease of native coronary artery without angina pectoris: Secondary | ICD-10-CM | POA: Diagnosis not present

## 2013-08-04 DIAGNOSIS — S0003XA Contusion of scalp, initial encounter: Secondary | ICD-10-CM | POA: Insufficient documentation

## 2013-08-04 DIAGNOSIS — Z95810 Presence of automatic (implantable) cardiac defibrillator: Secondary | ICD-10-CM | POA: Diagnosis not present

## 2013-08-04 DIAGNOSIS — W19XXXA Unspecified fall, initial encounter: Secondary | ICD-10-CM

## 2013-08-04 DIAGNOSIS — E119 Type 2 diabetes mellitus without complications: Secondary | ICD-10-CM | POA: Insufficient documentation

## 2013-08-04 DIAGNOSIS — Y9389 Activity, other specified: Secondary | ICD-10-CM | POA: Insufficient documentation

## 2013-08-04 DIAGNOSIS — W06XXXA Fall from bed, initial encounter: Secondary | ICD-10-CM | POA: Insufficient documentation

## 2013-08-04 DIAGNOSIS — S0083XA Contusion of other part of head, initial encounter: Secondary | ICD-10-CM

## 2013-08-04 DIAGNOSIS — Z79899 Other long term (current) drug therapy: Secondary | ICD-10-CM | POA: Insufficient documentation

## 2013-08-04 DIAGNOSIS — Y929 Unspecified place or not applicable: Secondary | ICD-10-CM | POA: Insufficient documentation

## 2013-08-04 LAB — BASIC METABOLIC PANEL
BUN: 21 mg/dL (ref 6–23)
CALCIUM: 9.8 mg/dL (ref 8.4–10.5)
CO2: 25 meq/L (ref 19–32)
CREATININE: 0.58 mg/dL (ref 0.50–1.10)
Chloride: 101 mEq/L (ref 96–112)
GFR calc Af Amer: >90 mL/min (ref >90)
GFR, EST NON AFRICAN AMERICAN: 82 mL/min — AB (ref >90)
GLUCOSE: 105 mg/dL — AB (ref 70–99)
Potassium: 4.5 mEq/L (ref 3.7–5.3)
SODIUM: 142 meq/L (ref 137–147)

## 2013-08-04 LAB — CBC WITH DIFFERENTIAL/PLATELET
BASOS ABS: 0 10*3/uL (ref 0.0–0.1)
Basophils Relative: 0 % (ref 0–1)
EOS ABS: 0.2 10*3/uL (ref 0.0–0.7)
EOS PCT: 1 % (ref 0–5)
HEMATOCRIT: 44.9 % (ref 36.0–46.0)
Hemoglobin: 15.3 g/dL — ABNORMAL HIGH (ref 12.0–15.0)
LYMPHS PCT: 19 % (ref 12–46)
Lymphs Abs: 2.1 10*3/uL (ref 0.7–4.0)
MCH: 29.7 pg (ref 26.0–34.0)
MCHC: 34.1 g/dL (ref 30.0–36.0)
MCV: 87 fL (ref 78.0–100.0)
MONO ABS: 0.7 10*3/uL (ref 0.1–1.0)
Monocytes Relative: 6 % (ref 3–12)
Neutro Abs: 8.2 10*3/uL — ABNORMAL HIGH (ref 1.7–7.7)
Neutrophils Relative %: 73 % (ref 43–77)
PLATELETS: 155 10*3/uL (ref 150–400)
RBC: 5.16 MIL/uL — ABNORMAL HIGH (ref 3.87–5.11)
RDW: 14.1 % (ref 11.5–15.5)
WBC: 11.1 10*3/uL — AB (ref 4.0–10.5)

## 2013-08-04 LAB — URINALYSIS, ROUTINE W REFLEX MICROSCOPIC
BILIRUBIN URINE: NEGATIVE
HGB URINE DIPSTICK: NEGATIVE
KETONES UR: NEGATIVE mg/dL
Nitrite: NEGATIVE
PROTEIN: NEGATIVE mg/dL
Specific Gravity, Urine: 1.016 (ref 1.005–1.030)
UROBILINOGEN UA: 0.2 mg/dL (ref 0.0–1.0)
pH: 5 (ref 5.0–8.0)

## 2013-08-04 LAB — URINE MICROSCOPIC-ADD ON

## 2013-08-04 MED ORDER — HYDROCODONE-ACETAMINOPHEN 5-325 MG PO TABS
1.0000 | ORAL_TABLET | Freq: Once | ORAL | Status: AC
  Administered 2013-08-04: 1 via ORAL
  Filled 2013-08-04: qty 1

## 2013-08-04 MED ORDER — CEPHALEXIN 500 MG PO CAPS: 500.0000 mg | ORAL_CAPSULE | Freq: Three times a day (TID) | ORAL | Status: DC

## 2013-08-04 NOTE — ED Provider Notes (Signed)
CSN: [id number]     Arrival date & time 08/04/13  7829 History   First MD Initiated Contact with Patient 08/04/13 0732     Chief Complaint  Patient presents with  . Fall   (Consider location/radiation/quality/duration/timing/severity/associated sxs/prior Treatment) HPI Comments: Patient with h/o DM, [employer] pacemaker/ICD implantation due to CHF/VT, not on blood thinners -- presents after a fall this morning. Patient got up out of her bed to go to the restroom and felt dizzy. Dizziness described as lightheadedeness. She denies palpitations. Her ICD did not fire. She struck her head. No suspected LOC. She denies neck pain. She lives at home with her son who assisted her. She has felt dizzy with standing and sitting up intermittently for the past week. No signs of stroke including: facial droop, slurred speech, aphasia, weakness/numbness in extremities, imbalance/trouble walking. No symptoms of infection. The onset of this condition was acute.Aggravating factors: none. Alleviating factors: none.     Patient is a 80 y.o. female presenting with fall. The history is provided by the patient, medical records and a relative. The history is limited by a language barrier.  Fall Pertinent negatives include no abdominal pain, chest pain, coughing, fatigue, fever, headaches, myalgias, nausea, neck pain, numbness, rash, sore throat, vomiting or weakness.    Past Medical History  Diagnosis Date  . Syncope   . CAD (coronary artery disease)   . SOB (shortness of breath)   . Edema   . DM2 (diabetes mellitus, type 2)   . HTN (hypertension)   . Fall    Past Surgical History  Procedure Laterality Date  . Back surgery    . Eye surgery     No family history on file. History  Substance Use Topics  . Smoking status: Never Smoker   . Smokeless tobacco: Not on file  . Alcohol Use: No   OB History   Grav Para Term Preterm Abortions TAB SAB Ect Mult Living                 Review of Systems   Constitutional: Negative for fever and fatigue.  HENT: Negative for rhinorrhea, sore throat and tinnitus.   Eyes: Negative for photophobia, pain, redness and visual disturbance.  Respiratory: Negative for cough and shortness of breath.   Cardiovascular: Negative for chest pain.  Gastrointestinal: Negative for nausea, vomiting, abdominal pain and diarrhea.  Genitourinary: Negative for dysuria.  Musculoskeletal: Negative for back pain, gait problem, myalgias and neck pain.  Skin: Positive for color change and wound. Negative for rash.  Neurological: Positive for dizziness. Negative for syncope, weakness, light-headedness, numbness and headaches.  Psychiatric/Behavioral: Negative for confusion and decreased concentration.    Allergies  Review of patient's allergies indicates no known allergies.  Home Medications   Current Outpatient Rx  Name  Route  Sig  Dispense  Refill  . amLODipine (NORVASC) 5 MG tablet   Oral   Take 2.5 mg by mouth 3 (three) times daily.          Marland Kitchen aspirin 81 MG tablet   Oral   Take 81 mg by mouth daily.         Marland Kitchen atorvastatin (LIPITOR) 20 MG tablet   Oral   Take 20 mg by mouth daily.         Marland Kitchen lisinopril (PRINIVIL,ZESTRIL) 20 MG tablet   Oral   Take 20 mg by mouth daily.         . pioglitazone-glimepiride (DUETACT) 30-2 MG per tablet  Oral   Take 1 tablet by mouth daily with breakfast.          BP 136/64  Pulse 66  Temp(Src) 98.2 F (36.8 C) (Oral)  Resp 18  SpO2 97% Physical Exam  Nursing note and vitals reviewed. Constitutional: She is oriented to person, place, and time. She appears well-developed and well-nourished.  HENT:  Head: Normocephalic. Head is without raccoon's eyes and without Battle's sign.  Right Ear: Tympanic membrane, external ear and ear canal normal. No hemotympanum.  Left Ear: Tympanic membrane, external ear and ear canal normal. No hemotympanum.  Nose: Nose normal. No nasal septal hematoma.  Mouth/Throat:  Uvula is midline, oropharynx is clear and moist and mucous membranes are normal.  Large hematoma and ecchymosis overlying R forehead. No lacerations.   Eyes: Conjunctivae, EOM and lids are normal. Pupils are equal, round, and reactive to light. Right eye exhibits no nystagmus. Left eye exhibits no nystagmus.  No visible hyphema noted  Neck: Normal range of motion. Neck supple.  Cardiovascular: Normal rate and regular rhythm.   Pulmonary/Chest: Effort normal and breath sounds normal. No respiratory distress. She has no wheezes. She has no rales.  Abdominal: Soft. There is no tenderness.  Musculoskeletal:       Cervical back: She exhibits normal range of motion, no tenderness and no bony tenderness.       Thoracic back: She exhibits no tenderness and no bony tenderness.       Lumbar back: She exhibits no tenderness and no bony tenderness.  Neurological: She is alert and oriented to person, place, and time. She has normal strength and normal reflexes. No cranial nerve deficit or sensory deficit. Coordination normal. GCS eye subscore is 4. GCS verbal subscore is 5. GCS motor subscore is 6.  Skin: Skin is warm and dry.  Psychiatric: She has a normal mood and affect.    ED Course  Procedures (including critical care time) Labs Review Labs Reviewed  URINALYSIS, ROUTINE W REFLEX MICROSCOPIC - Abnormal; Notable for the following:    APPearance CLOUDY (*)    Glucose, UA >1000 (*)    Leukocytes, UA MODERATE (*)    All other components within normal limits  CBC WITH DIFFERENTIAL - Abnormal; Notable for the following:    WBC 11.1 (*)    RBC 5.16 (*)    Hemoglobin 15.3 (*)    Neutro Abs 8.2 (*)    All other components within normal limits  BASIC METABOLIC PANEL - Abnormal; Notable for the following:    Glucose, Bld 105 (*)    GFR calc non Af Amer 82 (*)    All other components within normal limits  URINE MICROSCOPIC-ADD ON - Abnormal; Notable for the following:    Squamous Epithelial / LPF  MANY (*)    Bacteria, UA FEW (*)    All other components within normal limits  URINE CULTURE   Imaging Review Ct Head Wo Contrast  08/04/2013   CLINICAL DATA:  Fall.  EXAM: CT HEAD WITHOUT CONTRAST  CT MAXILLOFACIAL WITHOUT CONTRAST  CT CERVICAL SPINE WITHOUT CONTRAST  TECHNIQUE: Multidetector CT imaging of the head, cervical spine, and maxillofacial structures were performed using the standard protocol without intravenous contrast. Multiplanar CT image reconstructions of the cervical spine and maxillofacial structures were also generated.  COMPARISON:  CT head February 13, 2011.  FINDINGS: CT HEAD FINDINGS  Diffuse mild sulcal effacement due to hyperostosis totalis. Ventricles are normal. No intraparenchymal hemorrhage, mass effect nor midline shift. Patchy supratentorial  white matter hypodensities are less than expected for patient's age and though non-specific suggest sequelae of chronic small vessel ischemic disease. No acute large vascular territory infarcts.  No abnormal extra-axial fluid collections. Basal cisterns are patent. Moderate calcific atherosclerosis of the carotid siphons.  Right frontal moderate scalp hematoma without subcutaneous gas or radiopaque foreign bodies. No underlying skull fracture.  CT MAXILLOFACIAL FINDINGS  Right periorbital scalp hematoma without postseptal hematoma. Status post bilateral ocular lens implants, orbital contents are otherwise unremarkable. No facial fracture.  Patient is edentulous. Mandible condyles are located. No destructive bony lesions. Nasal septum mildly deviated to the right. Paranasal sinuses are well aerated. Bilateral palatine tonsilliths.  CT CERVICAL SPINE FINDINGS  Cervical vertebral bodies and posterior elements are intact and aligned with maintenance of the cervical lordosis. Intervertebral disc heights preserved with mild ventral endplate spurring. No destructive bony lesions. Osteopenic patient. C1-2 articulation maintained with mild  arthropathy. Included prevertebral and paraspinal soft tissues are nonsuspicious ; palatine tonsilliths and mild vascular calcifications.  IMPRESSION: CT head: Right frontal scalp hematoma without underlying skull fracture and no acute intracranial process.  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  Maxillofacial CT: Right periorbital soft tissue swelling without postseptal hematoma and no facial fracture.  CT of cervical spine: No acute fracture nor malalignment. No advanced degenerative change for age.   Electronically Signed   By: Awilda Metro   On: 08/04/2013 06:56   Ct Cervical Spine Wo Contrast  08/04/2013   CLINICAL DATA:  Fall.  EXAM: CT HEAD WITHOUT CONTRAST  CT MAXILLOFACIAL WITHOUT CONTRAST  CT CERVICAL SPINE WITHOUT CONTRAST  TECHNIQUE: Multidetector CT imaging of the head, cervical spine, and maxillofacial structures were performed using the standard protocol without intravenous contrast. Multiplanar CT image reconstructions of the cervical spine and maxillofacial structures were also generated.  COMPARISON:  CT head February 13, 2011.  FINDINGS: CT HEAD FINDINGS  Diffuse mild sulcal effacement due to hyperostosis totalis. Ventricles are normal. No intraparenchymal hemorrhage, mass effect nor midline shift. Patchy supratentorial white matter hypodensities are less than expected for patient's age and though non-specific suggest sequelae of chronic small vessel ischemic disease. No acute large vascular territory infarcts.  No abnormal extra-axial fluid collections. Basal cisterns are patent. Moderate calcific atherosclerosis of the carotid siphons.  Right frontal moderate scalp hematoma without subcutaneous gas or radiopaque foreign bodies. No underlying skull fracture.  CT MAXILLOFACIAL FINDINGS  Right periorbital scalp hematoma without postseptal hematoma. Status post bilateral ocular lens implants, orbital contents are otherwise unremarkable. No facial  fracture.  Patient is edentulous. Mandible condyles are located. No destructive bony lesions. Nasal septum mildly deviated to the right. Paranasal sinuses are well aerated. Bilateral palatine tonsilliths.  CT CERVICAL SPINE FINDINGS  Cervical vertebral bodies and posterior elements are intact and aligned with maintenance of the cervical lordosis. Intervertebral disc heights preserved with mild ventral endplate spurring. No destructive bony lesions. Osteopenic patient. C1-2 articulation maintained with mild arthropathy. Included prevertebral and paraspinal soft tissues are nonsuspicious ; palatine tonsilliths and mild vascular calcifications.  IMPRESSION: CT head: Right frontal scalp hematoma without underlying skull fracture and no acute intracranial process.  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  Maxillofacial CT: Right periorbital soft tissue swelling without postseptal hematoma and no facial fracture.  CT of cervical spine: No acute fracture nor malalignment. No advanced degenerative change for age.   Electronically Signed   By: Awilda Metro   On: 08/04/2013 06:56   Ct  Maxillofacial Wo Cm  08/04/2013   CLINICAL DATA:  Fall.  EXAM: CT HEAD WITHOUT CONTRAST  CT MAXILLOFACIAL WITHOUT CONTRAST  CT CERVICAL SPINE WITHOUT CONTRAST  TECHNIQUE: Multidetector CT imaging of the head, cervical spine, and maxillofacial structures were performed using the standard protocol without intravenous contrast. Multiplanar CT image reconstructions of the cervical spine and maxillofacial structures were also generated.  COMPARISON:  CT head February 13, 2011.  FINDINGS: CT HEAD FINDINGS  Diffuse mild sulcal effacement due to hyperostosis totalis. Ventricles are normal. No intraparenchymal hemorrhage, mass effect nor midline shift. Patchy supratentorial white matter hypodensities are less than expected for patient's age and though non-specific suggest sequelae of chronic small vessel  ischemic disease. No acute large vascular territory infarcts.  No abnormal extra-axial fluid collections. Basal cisterns are patent. Moderate calcific atherosclerosis of the carotid siphons.  Right frontal moderate scalp hematoma without subcutaneous gas or radiopaque foreign bodies. No underlying skull fracture.  CT MAXILLOFACIAL FINDINGS  Right periorbital scalp hematoma without postseptal hematoma. Status post bilateral ocular lens implants, orbital contents are otherwise unremarkable. No facial fracture.  Patient is edentulous. Mandible condyles are located. No destructive bony lesions. Nasal septum mildly deviated to the right. Paranasal sinuses are well aerated. Bilateral palatine tonsilliths.  CT CERVICAL SPINE FINDINGS  Cervical vertebral bodies and posterior elements are intact and aligned with maintenance of the cervical lordosis. Intervertebral disc heights preserved with mild ventral endplate spurring. No destructive bony lesions. Osteopenic patient. C1-2 articulation maintained with mild arthropathy. Included prevertebral and paraspinal soft tissues are nonsuspicious ; palatine tonsilliths and mild vascular calcifications.  IMPRESSION: CT head: Right frontal scalp hematoma without underlying skull fracture and no acute intracranial process.  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  Maxillofacial CT: Right periorbital soft tissue swelling without postseptal hematoma and no facial fracture.  CT of cervical spine: No acute fracture nor malalignment. No advanced degenerative change for age.   Electronically Signed   By: Awilda Metro   On: 08/04/2013 06:56    EKG Interpretation   None      7:33 AM Patient seen and examined. Work-up initiated.   Vital signs reviewed and are as follows: Filed Vitals:   08/04/13 1130  BP: 155/55  Pulse: 64  Temp:   Resp: 15   10:59 AM Reviewed findings with Dr. Oletta Lamas who will see. Will ask that pacemaker be  interrogated.   11:56 AM Spoke with [employer] re: pacemaker transmission. No episodes of arrhythmia detected. Dr. Oletta Lamas has seen. Will d/c to home.  Patient was counseled on head injury precautions and symptoms that should indicate their return to the ED.  These include severe worsening headache, vision changes, confusion, loss of consciousness, trouble walking, nausea & vomiting, or weakness/tingling in extremities.      MDM   1. Fall   2. Contusion of face   3. Dizziness   4. Urinary tract infection    Head injury: neg CT, normal neuro exam.   Dizziness: Pacemaker interrogation reassuring, labs show likely UTI and will treat. Otherwise reassuring. No significant orthostasis.   No dangerous or life-threatening conditions suspected or identified by history, physical exam, and by work-up. No indications for hospitalization identified.      Renne Crigler, PA-C 08/04/13 [phone]

## 2013-08-04 NOTE — ED Provider Notes (Signed)
Medical screening examination/treatment/procedure(s) were conducted as a shared visit with non-physician practitioner(s) and myself.  I personally evaluated the patient during the encounter.  EKG Interpretation    Date/Time:  Saturday August 04 2013 08:04:23 EST Ventricular Rate:  67 PR Interval:  207 QRS Duration: 122 QT Interval:  466 QTC Calculation: 492 R Axis:   -95 Text Interpretation:  Sinus rhythm suspect pacing Nonspecific IVCD with LAD Abnormal ekg No significant change since last tracing Confirmed by [facility]  MD, MICHEAL ([id number]) on 08/04/2013 12:08:51 PM            Pt with recent dizzy episodes, not any different than usual, denies CP, SOB.  She fell, hit head, denies neck pain.  No neuro deficits currently.  CT's of head, c spine, face shows no acute traumatic injuries, significantly.  Interrogation of pacer shows no arrythmia recently.  No CP here.  ECG apepars morphologically the same as others.  Stable for discharge to home with family.    Gavin Pound. Oletta Lamas, MD 08/04/13 7035

## 2013-08-04 NOTE — ED Notes (Signed)
Patient transported to CT scan . 

## 2013-08-04 NOTE — ED Notes (Signed)
The pt fell getting out of bed one hour ago. She has bruises and swelling in her face .  No loc

## 2013-08-04 NOTE — ED Notes (Signed)
Nurse first rounds : Pt. resting at waiting area with no distress , denies pain / respirations unlabored , nurse explained delay / wait time and process to pt. Family with pt. , respirations unlabored / no pain at this time . Alert and oriented .

## 2013-08-04 NOTE — Discharge Instructions (Signed)
Please read and follow all provided instructions.  Your diagnoses today include:  1. Fall   2. Contusion of face   3. Dizziness   4. Urinary tract infection     Tests performed today include:  CT scan of your head, neck, and face that did not show any serious injury.  EKG  Blood counts and electrolytes  Urine test - shows infection in urine  Vital signs. See below for your results today.   Medications prescribed:   Keflex (cephalexin) - antibiotic  You have been prescribed an antibiotic medicine: take the entire course of medicine even if you are feeling better. Stopping early can cause the antibiotic not to work.  Take any prescribed medications only as directed.  Home care instructions:  Follow any educational materials contained in this packet.  BE VERY CAREFUL not to take multiple medicines containing Tylenol (also called acetaminophen). Doing so can lead to an overdose which can damage your liver and cause liver failure and possibly death.   Follow-up instructions: Please follow-up with your primary care provider in the next 3 days for further evaluation of your symptoms. If you do not have a primary care doctor -- see below for referral information.   Return instructions:  SEEK IMMEDIATE MEDICAL ATTENTION IF:  There is confusion or drowsiness (although children frequently become drowsy after injury).   You cannot awaken the injured person.   You have more than one episode of vomiting.   You notice dizziness or unsteadiness which is getting worse, or inability to walk.   You have convulsions or unconsciousness.   You experience severe, persistent headaches not relieved by Tylenol.  You cannot use arms or legs normally.   There are changes in pupil sizes. (This is the black center in the colored part of the eye)   There is clear or bloody discharge from the nose or ears.   You have change in speech, vision, swallowing, or understanding.   Localized  weakness, numbness, tingling, or change in bowel or bladder control.  You have any other emergent concerns.  Additional Information: You have had a head injury which does not appear to require admission at this time.  Your vital signs today were: BP 155/55   Pulse 64   Temp(Src) 98.2 F (36.8 C) (Oral)   Resp 15   SpO2 95% If your blood pressure (BP) was elevated above 135/85 this visit, please have this repeated by your doctor within one month. --------------

## 2013-08-05 LAB — URINE CULTURE: Colony Count: 5000

## 2013-08-23 DIAGNOSIS — H4010X0 Unspecified open-angle glaucoma, stage unspecified: Secondary | ICD-10-CM | POA: Diagnosis not present

## 2013-09-04 DIAGNOSIS — L02619 Cutaneous abscess of unspecified foot: Secondary | ICD-10-CM | POA: Diagnosis not present

## 2013-09-04 DIAGNOSIS — L03039 Cellulitis of unspecified toe: Secondary | ICD-10-CM | POA: Diagnosis not present

## 2013-11-01 DIAGNOSIS — E119 Type 2 diabetes mellitus without complications: Secondary | ICD-10-CM | POA: Diagnosis not present

## 2013-11-01 DIAGNOSIS — I10 Essential (primary) hypertension: Secondary | ICD-10-CM | POA: Diagnosis not present

## 2013-11-06 DIAGNOSIS — E119 Type 2 diabetes mellitus without complications: Secondary | ICD-10-CM | POA: Diagnosis not present

## 2013-11-06 DIAGNOSIS — I10 Essential (primary) hypertension: Secondary | ICD-10-CM | POA: Diagnosis not present

## 2013-11-06 DIAGNOSIS — E039 Hypothyroidism, unspecified: Secondary | ICD-10-CM | POA: Diagnosis not present

## 2013-12-06 DIAGNOSIS — Z95810 Presence of automatic (implantable) cardiac defibrillator: Secondary | ICD-10-CM | POA: Diagnosis not present

## 2013-12-06 DIAGNOSIS — Z95818 Presence of other cardiac implants and grafts: Secondary | ICD-10-CM | POA: Diagnosis not present

## 2013-12-06 DIAGNOSIS — I428 Other cardiomyopathies: Secondary | ICD-10-CM | POA: Diagnosis not present

## 2014-02-05 DIAGNOSIS — E119 Type 2 diabetes mellitus without complications: Secondary | ICD-10-CM | POA: Diagnosis not present

## 2014-02-05 DIAGNOSIS — E039 Hypothyroidism, unspecified: Secondary | ICD-10-CM | POA: Diagnosis not present

## 2014-02-05 DIAGNOSIS — I10 Essential (primary) hypertension: Secondary | ICD-10-CM | POA: Diagnosis not present

## 2014-02-08 DIAGNOSIS — I10 Essential (primary) hypertension: Secondary | ICD-10-CM | POA: Diagnosis not present

## 2014-02-08 DIAGNOSIS — E119 Type 2 diabetes mellitus without complications: Secondary | ICD-10-CM | POA: Diagnosis not present

## 2014-02-08 DIAGNOSIS — E039 Hypothyroidism, unspecified: Secondary | ICD-10-CM | POA: Diagnosis not present

## 2014-02-21 DIAGNOSIS — H4010X0 Unspecified open-angle glaucoma, stage unspecified: Secondary | ICD-10-CM | POA: Diagnosis not present

## 2014-03-27 ENCOUNTER — Other Ambulatory Visit: Payer: Self-pay | Admitting: Internal Medicine

## 2014-03-27 DIAGNOSIS — Z1231 Encounter for screening mammogram for malignant neoplasm of breast: Secondary | ICD-10-CM

## 2014-04-11 DIAGNOSIS — S42302A Unspecified fracture of shaft of humerus, left arm, initial encounter for closed fracture: Secondary | ICD-10-CM | POA: Diagnosis not present

## 2014-04-11 DIAGNOSIS — Z95810 Presence of automatic (implantable) cardiac defibrillator: Secondary | ICD-10-CM | POA: Diagnosis not present

## 2014-04-11 DIAGNOSIS — I429 Cardiomyopathy, unspecified: Secondary | ICD-10-CM | POA: Diagnosis not present

## 2014-04-11 DIAGNOSIS — I509 Heart failure, unspecified: Secondary | ICD-10-CM | POA: Diagnosis not present

## 2014-04-11 DIAGNOSIS — T148 Other injury of unspecified body region: Secondary | ICD-10-CM | POA: Diagnosis not present

## 2014-05-10 DIAGNOSIS — E1310 Other specified diabetes mellitus with ketoacidosis without coma: Secondary | ICD-10-CM | POA: Diagnosis not present

## 2014-05-10 DIAGNOSIS — E039 Hypothyroidism, unspecified: Secondary | ICD-10-CM | POA: Diagnosis not present

## 2014-05-10 DIAGNOSIS — I10 Essential (primary) hypertension: Secondary | ICD-10-CM | POA: Diagnosis not present

## 2014-05-15 ENCOUNTER — Ambulatory Visit
Admission: RE | Admit: 2014-05-15 | Discharge: 2014-05-15 | Disposition: A | Discharge status: Home / Self Care | Payer: Medicare Other | Source: Ambulatory Visit | Attending: Internal Medicine | Admitting: Internal Medicine

## 2014-05-15 ENCOUNTER — Encounter (INDEPENDENT_AMBULATORY_CARE_PROVIDER_SITE_OTHER): Payer: Self-pay

## 2014-05-15 DIAGNOSIS — E119 Type 2 diabetes mellitus without complications: Secondary | ICD-10-CM | POA: Diagnosis not present

## 2014-05-15 DIAGNOSIS — Z1231 Encounter for screening mammogram for malignant neoplasm of breast: Secondary | ICD-10-CM | POA: Diagnosis not present

## 2014-05-15 DIAGNOSIS — E669 Obesity, unspecified: Secondary | ICD-10-CM | POA: Diagnosis not present

## 2014-05-15 DIAGNOSIS — I10 Essential (primary) hypertension: Secondary | ICD-10-CM | POA: Diagnosis not present

## 2014-05-15 DIAGNOSIS — E039 Hypothyroidism, unspecified: Secondary | ICD-10-CM | POA: Diagnosis not present

## 2014-07-25 DIAGNOSIS — R21 Rash and other nonspecific skin eruption: Secondary | ICD-10-CM | POA: Diagnosis not present

## 2014-07-25 DIAGNOSIS — I509 Heart failure, unspecified: Secondary | ICD-10-CM | POA: Diagnosis not present

## 2014-07-25 DIAGNOSIS — Z95810 Presence of automatic (implantable) cardiac defibrillator: Secondary | ICD-10-CM | POA: Diagnosis not present

## 2014-07-25 DIAGNOSIS — G40909 Epilepsy, unspecified, not intractable, without status epilepticus: Secondary | ICD-10-CM | POA: Diagnosis not present

## 2014-07-25 DIAGNOSIS — I5022 Chronic systolic (congestive) heart failure: Secondary | ICD-10-CM | POA: Diagnosis not present

## 2014-07-25 DIAGNOSIS — I10 Essential (primary) hypertension: Secondary | ICD-10-CM | POA: Diagnosis not present

## 2014-07-25 DIAGNOSIS — Z7982 Long term (current) use of aspirin: Secondary | ICD-10-CM | POA: Diagnosis not present

## 2014-07-25 DIAGNOSIS — E780 Pure hypercholesterolemia: Secondary | ICD-10-CM | POA: Diagnosis not present

## 2014-07-25 DIAGNOSIS — R9431 Abnormal electrocardiogram [ECG] [EKG]: Secondary | ICD-10-CM | POA: Diagnosis not present

## 2014-08-05 DIAGNOSIS — I10 Essential (primary) hypertension: Secondary | ICD-10-CM | POA: Diagnosis not present

## 2014-08-13 DIAGNOSIS — E118 Type 2 diabetes mellitus with unspecified complications: Secondary | ICD-10-CM | POA: Diagnosis not present

## 2014-08-13 DIAGNOSIS — E669 Obesity, unspecified: Secondary | ICD-10-CM | POA: Diagnosis not present

## 2014-08-13 DIAGNOSIS — I10 Essential (primary) hypertension: Secondary | ICD-10-CM | POA: Diagnosis not present

## 2014-08-27 DIAGNOSIS — Z95810 Presence of automatic (implantable) cardiac defibrillator: Secondary | ICD-10-CM | POA: Diagnosis not present

## 2014-08-27 DIAGNOSIS — I255 Ischemic cardiomyopathy: Secondary | ICD-10-CM | POA: Diagnosis not present

## 2014-08-27 DIAGNOSIS — Z95818 Presence of other cardiac implants and grafts: Secondary | ICD-10-CM | POA: Diagnosis not present

## 2014-08-27 DIAGNOSIS — I429 Cardiomyopathy, unspecified: Secondary | ICD-10-CM | POA: Diagnosis not present

## 2014-08-28 DIAGNOSIS — I10 Essential (primary) hypertension: Secondary | ICD-10-CM | POA: Diagnosis not present

## 2014-08-28 DIAGNOSIS — E118 Type 2 diabetes mellitus with unspecified complications: Secondary | ICD-10-CM | POA: Diagnosis not present

## 2014-08-28 DIAGNOSIS — E039 Hypothyroidism, unspecified: Secondary | ICD-10-CM | POA: Diagnosis not present

## 2014-09-04 DIAGNOSIS — I10 Essential (primary) hypertension: Secondary | ICD-10-CM | POA: Diagnosis not present

## 2014-09-04 DIAGNOSIS — E119 Type 2 diabetes mellitus without complications: Secondary | ICD-10-CM | POA: Diagnosis not present

## 2014-09-04 DIAGNOSIS — E039 Hypothyroidism, unspecified: Secondary | ICD-10-CM | POA: Diagnosis not present

## 2014-09-05 DIAGNOSIS — E118 Type 2 diabetes mellitus with unspecified complications: Secondary | ICD-10-CM | POA: Diagnosis not present

## 2014-09-05 DIAGNOSIS — E039 Hypothyroidism, unspecified: Secondary | ICD-10-CM | POA: Diagnosis not present

## 2014-09-05 DIAGNOSIS — I10 Essential (primary) hypertension: Secondary | ICD-10-CM | POA: Diagnosis not present

## 2014-09-16 DIAGNOSIS — H4011X4 Primary open-angle glaucoma, indeterminate stage: Secondary | ICD-10-CM | POA: Diagnosis not present

## 2014-11-26 DIAGNOSIS — Z95818 Presence of other cardiac implants and grafts: Secondary | ICD-10-CM | POA: Diagnosis not present

## 2014-11-26 DIAGNOSIS — I429 Cardiomyopathy, unspecified: Secondary | ICD-10-CM | POA: Diagnosis not present

## 2014-11-26 DIAGNOSIS — Z95810 Presence of automatic (implantable) cardiac defibrillator: Secondary | ICD-10-CM | POA: Diagnosis not present

## 2014-12-03 DIAGNOSIS — Z7982 Long term (current) use of aspirin: Secondary | ICD-10-CM | POA: Diagnosis not present

## 2014-12-03 DIAGNOSIS — I472 Ventricular tachycardia: Secondary | ICD-10-CM | POA: Diagnosis not present

## 2014-12-03 DIAGNOSIS — Z95810 Presence of automatic (implantable) cardiac defibrillator: Secondary | ICD-10-CM | POA: Diagnosis not present

## 2014-12-03 DIAGNOSIS — I10 Essential (primary) hypertension: Secondary | ICD-10-CM | POA: Diagnosis not present

## 2014-12-03 DIAGNOSIS — I5022 Chronic systolic (congestive) heart failure: Secondary | ICD-10-CM | POA: Diagnosis not present

## 2014-12-03 DIAGNOSIS — E785 Hyperlipidemia, unspecified: Secondary | ICD-10-CM | POA: Diagnosis not present

## 2014-12-10 DIAGNOSIS — I10 Essential (primary) hypertension: Secondary | ICD-10-CM | POA: Diagnosis not present

## 2014-12-10 DIAGNOSIS — E119 Type 2 diabetes mellitus without complications: Secondary | ICD-10-CM | POA: Diagnosis not present

## 2014-12-10 DIAGNOSIS — E039 Hypothyroidism, unspecified: Secondary | ICD-10-CM | POA: Diagnosis not present

## 2014-12-17 DIAGNOSIS — E119 Type 2 diabetes mellitus without complications: Secondary | ICD-10-CM | POA: Diagnosis not present

## 2014-12-17 DIAGNOSIS — E039 Hypothyroidism, unspecified: Secondary | ICD-10-CM | POA: Diagnosis not present

## 2014-12-17 DIAGNOSIS — I10 Essential (primary) hypertension: Secondary | ICD-10-CM | POA: Diagnosis not present

## 2015-03-04 DIAGNOSIS — Z95810 Presence of automatic (implantable) cardiac defibrillator: Secondary | ICD-10-CM | POA: Diagnosis not present

## 2015-03-04 DIAGNOSIS — Z136 Encounter for screening for cardiovascular disorders: Secondary | ICD-10-CM | POA: Diagnosis not present

## 2015-03-04 DIAGNOSIS — Z95818 Presence of other cardiac implants and grafts: Secondary | ICD-10-CM | POA: Diagnosis not present

## 2015-03-04 DIAGNOSIS — I429 Cardiomyopathy, unspecified: Secondary | ICD-10-CM | POA: Diagnosis not present

## 2015-03-18 DIAGNOSIS — H4011X4 Primary open-angle glaucoma, indeterminate stage: Secondary | ICD-10-CM | POA: Diagnosis not present

## 2015-03-25 DIAGNOSIS — H4011X1 Primary open-angle glaucoma, mild stage: Secondary | ICD-10-CM | POA: Diagnosis not present

## 2015-03-25 DIAGNOSIS — E119 Type 2 diabetes mellitus without complications: Secondary | ICD-10-CM | POA: Diagnosis not present

## 2015-04-10 DIAGNOSIS — E039 Hypothyroidism, unspecified: Secondary | ICD-10-CM | POA: Diagnosis not present

## 2015-04-10 DIAGNOSIS — I10 Essential (primary) hypertension: Secondary | ICD-10-CM | POA: Diagnosis not present

## 2015-04-10 DIAGNOSIS — E119 Type 2 diabetes mellitus without complications: Secondary | ICD-10-CM | POA: Diagnosis not present

## 2015-04-16 DIAGNOSIS — E039 Hypothyroidism, unspecified: Secondary | ICD-10-CM | POA: Diagnosis not present

## 2015-04-16 DIAGNOSIS — I10 Essential (primary) hypertension: Secondary | ICD-10-CM | POA: Diagnosis not present

## 2015-04-16 DIAGNOSIS — E119 Type 2 diabetes mellitus without complications: Secondary | ICD-10-CM | POA: Diagnosis not present

## 2015-07-02 ENCOUNTER — Other Ambulatory Visit: Payer: Self-pay

## 2015-07-02 DIAGNOSIS — Z1231 Encounter for screening mammogram for malignant neoplasm of breast: Secondary | ICD-10-CM

## 2015-07-04 ENCOUNTER — Ambulatory Visit
Admission: RE | Admit: 2015-07-04 | Discharge: 2015-07-04 | Disposition: A | Discharge status: Home / Self Care | Payer: Medicare Other | Source: Ambulatory Visit

## 2015-07-04 DIAGNOSIS — Z1231 Encounter for screening mammogram for malignant neoplasm of breast: Secondary | ICD-10-CM

## 2015-07-17 DIAGNOSIS — E039 Hypothyroidism, unspecified: Secondary | ICD-10-CM | POA: Diagnosis not present

## 2015-07-17 DIAGNOSIS — E119 Type 2 diabetes mellitus without complications: Secondary | ICD-10-CM | POA: Diagnosis not present

## 2015-07-17 DIAGNOSIS — I10 Essential (primary) hypertension: Secondary | ICD-10-CM | POA: Diagnosis not present

## 2015-07-22 DIAGNOSIS — I10 Essential (primary) hypertension: Secondary | ICD-10-CM | POA: Diagnosis not present

## 2015-07-22 DIAGNOSIS — E119 Type 2 diabetes mellitus without complications: Secondary | ICD-10-CM | POA: Diagnosis not present

## 2015-07-22 DIAGNOSIS — E039 Hypothyroidism, unspecified: Secondary | ICD-10-CM | POA: Diagnosis not present

## 2015-09-04 DIAGNOSIS — Z95810 Presence of automatic (implantable) cardiac defibrillator: Secondary | ICD-10-CM | POA: Diagnosis not present

## 2015-09-04 DIAGNOSIS — I429 Cardiomyopathy, unspecified: Secondary | ICD-10-CM | POA: Diagnosis not present

## 2015-09-22 DIAGNOSIS — H401131 Primary open-angle glaucoma, bilateral, mild stage: Secondary | ICD-10-CM | POA: Diagnosis not present

## 2015-09-22 DIAGNOSIS — H02056 Trichiasis without entropian left eye, unspecified eyelid: Secondary | ICD-10-CM | POA: Diagnosis not present

## 2015-10-23 DIAGNOSIS — I5020 Unspecified systolic (congestive) heart failure: Secondary | ICD-10-CM | POA: Diagnosis not present

## 2015-10-23 DIAGNOSIS — I429 Cardiomyopathy, unspecified: Secondary | ICD-10-CM | POA: Diagnosis not present

## 2015-10-23 DIAGNOSIS — I10 Essential (primary) hypertension: Secondary | ICD-10-CM | POA: Diagnosis not present

## 2015-10-23 DIAGNOSIS — Z95810 Presence of automatic (implantable) cardiac defibrillator: Secondary | ICD-10-CM | POA: Diagnosis not present

## 2015-10-23 DIAGNOSIS — I2510 Atherosclerotic heart disease of native coronary artery without angina pectoris: Secondary | ICD-10-CM | POA: Diagnosis not present

## 2015-10-23 DIAGNOSIS — E119 Type 2 diabetes mellitus without complications: Secondary | ICD-10-CM | POA: Diagnosis not present

## 2015-10-23 DIAGNOSIS — R0602 Shortness of breath: Secondary | ICD-10-CM | POA: Diagnosis not present

## 2015-10-23 DIAGNOSIS — Z7982 Long term (current) use of aspirin: Secondary | ICD-10-CM | POA: Diagnosis not present

## 2015-10-23 DIAGNOSIS — I5022 Chronic systolic (congestive) heart failure: Secondary | ICD-10-CM | POA: Diagnosis not present

## 2015-10-23 DIAGNOSIS — E785 Hyperlipidemia, unspecified: Secondary | ICD-10-CM | POA: Diagnosis not present

## 2015-12-18 DIAGNOSIS — E119 Type 2 diabetes mellitus without complications: Secondary | ICD-10-CM | POA: Diagnosis not present

## 2015-12-18 DIAGNOSIS — E039 Hypothyroidism, unspecified: Secondary | ICD-10-CM | POA: Diagnosis not present

## 2015-12-18 DIAGNOSIS — I10 Essential (primary) hypertension: Secondary | ICD-10-CM | POA: Diagnosis not present

## 2015-12-18 DIAGNOSIS — E559 Vitamin D deficiency, unspecified: Secondary | ICD-10-CM | POA: Diagnosis not present

## 2015-12-18 DIAGNOSIS — Z0000 Encounter for general adult medical examination without abnormal findings: Secondary | ICD-10-CM | POA: Diagnosis not present

## 2015-12-25 DIAGNOSIS — E119 Type 2 diabetes mellitus without complications: Secondary | ICD-10-CM | POA: Diagnosis not present

## 2015-12-25 DIAGNOSIS — R21 Rash and other nonspecific skin eruption: Secondary | ICD-10-CM | POA: Diagnosis not present

## 2015-12-25 DIAGNOSIS — E039 Hypothyroidism, unspecified: Secondary | ICD-10-CM | POA: Diagnosis not present

## 2015-12-25 DIAGNOSIS — I10 Essential (primary) hypertension: Secondary | ICD-10-CM | POA: Diagnosis not present

## 2016-01-01 DIAGNOSIS — Z7982 Long term (current) use of aspirin: Secondary | ICD-10-CM | POA: Diagnosis not present

## 2016-01-01 DIAGNOSIS — I472 Ventricular tachycardia: Secondary | ICD-10-CM | POA: Diagnosis not present

## 2016-01-01 DIAGNOSIS — I509 Heart failure, unspecified: Secondary | ICD-10-CM | POA: Diagnosis not present

## 2016-01-01 DIAGNOSIS — E7800 Pure hypercholesterolemia, unspecified: Secondary | ICD-10-CM | POA: Diagnosis not present

## 2016-01-01 DIAGNOSIS — I110 Hypertensive heart disease with heart failure: Secondary | ICD-10-CM | POA: Diagnosis not present

## 2016-01-01 DIAGNOSIS — Z95810 Presence of automatic (implantable) cardiac defibrillator: Secondary | ICD-10-CM | POA: Diagnosis not present

## 2016-01-01 DIAGNOSIS — Z79899 Other long term (current) drug therapy: Secondary | ICD-10-CM | POA: Diagnosis not present

## 2016-03-02 ENCOUNTER — Other Ambulatory Visit: Payer: Self-pay

## 2016-04-08 DIAGNOSIS — H401131 Primary open-angle glaucoma, bilateral, mild stage: Secondary | ICD-10-CM | POA: Diagnosis not present

## 2016-04-15 DIAGNOSIS — E119 Type 2 diabetes mellitus without complications: Secondary | ICD-10-CM | POA: Diagnosis not present

## 2016-04-19 DIAGNOSIS — Z95810 Presence of automatic (implantable) cardiac defibrillator: Secondary | ICD-10-CM | POA: Diagnosis not present

## 2016-04-19 DIAGNOSIS — I5022 Chronic systolic (congestive) heart failure: Secondary | ICD-10-CM | POA: Diagnosis not present

## 2016-04-21 DIAGNOSIS — I10 Essential (primary) hypertension: Secondary | ICD-10-CM | POA: Diagnosis not present

## 2016-04-21 DIAGNOSIS — E039 Hypothyroidism, unspecified: Secondary | ICD-10-CM | POA: Diagnosis not present

## 2016-04-21 DIAGNOSIS — E789 Disorder of lipoprotein metabolism, unspecified: Secondary | ICD-10-CM | POA: Diagnosis not present

## 2016-04-21 DIAGNOSIS — E119 Type 2 diabetes mellitus without complications: Secondary | ICD-10-CM | POA: Diagnosis not present

## 2016-04-26 DIAGNOSIS — I10 Essential (primary) hypertension: Secondary | ICD-10-CM | POA: Diagnosis not present

## 2016-04-26 DIAGNOSIS — E119 Type 2 diabetes mellitus without complications: Secondary | ICD-10-CM | POA: Diagnosis not present

## 2016-04-26 DIAGNOSIS — E039 Hypothyroidism, unspecified: Secondary | ICD-10-CM | POA: Diagnosis not present

## 2016-07-20 DIAGNOSIS — E119 Type 2 diabetes mellitus without complications: Secondary | ICD-10-CM | POA: Diagnosis not present

## 2016-07-20 DIAGNOSIS — E039 Hypothyroidism, unspecified: Secondary | ICD-10-CM | POA: Diagnosis not present

## 2016-07-20 DIAGNOSIS — I10 Essential (primary) hypertension: Secondary | ICD-10-CM | POA: Diagnosis not present

## 2016-07-21 DIAGNOSIS — I5022 Chronic systolic (congestive) heart failure: Secondary | ICD-10-CM | POA: Diagnosis not present

## 2016-07-22 DIAGNOSIS — Z95818 Presence of other cardiac implants and grafts: Secondary | ICD-10-CM | POA: Diagnosis not present

## 2016-07-22 DIAGNOSIS — I509 Heart failure, unspecified: Secondary | ICD-10-CM | POA: Diagnosis not present

## 2016-07-22 DIAGNOSIS — Z95810 Presence of automatic (implantable) cardiac defibrillator: Secondary | ICD-10-CM | POA: Diagnosis not present

## 2016-07-27 DIAGNOSIS — I10 Essential (primary) hypertension: Secondary | ICD-10-CM | POA: Diagnosis not present

## 2016-07-27 DIAGNOSIS — E7800 Pure hypercholesterolemia, unspecified: Secondary | ICD-10-CM | POA: Diagnosis not present

## 2016-07-27 DIAGNOSIS — E119 Type 2 diabetes mellitus without complications: Secondary | ICD-10-CM | POA: Diagnosis not present

## 2016-07-27 DIAGNOSIS — E039 Hypothyroidism, unspecified: Secondary | ICD-10-CM | POA: Diagnosis not present

## 2016-10-19 DIAGNOSIS — E7800 Pure hypercholesterolemia, unspecified: Secondary | ICD-10-CM | POA: Diagnosis not present

## 2016-10-19 DIAGNOSIS — I420 Dilated cardiomyopathy: Secondary | ICD-10-CM | POA: Diagnosis not present

## 2016-10-19 DIAGNOSIS — I255 Ischemic cardiomyopathy: Secondary | ICD-10-CM | POA: Diagnosis not present

## 2016-10-19 DIAGNOSIS — E119 Type 2 diabetes mellitus without complications: Secondary | ICD-10-CM | POA: Diagnosis not present

## 2016-10-19 DIAGNOSIS — Z4502 Encounter for adjustment and management of automatic implantable cardiac defibrillator: Secondary | ICD-10-CM | POA: Diagnosis not present

## 2016-10-19 DIAGNOSIS — E039 Hypothyroidism, unspecified: Secondary | ICD-10-CM | POA: Diagnosis not present

## 2016-10-19 DIAGNOSIS — Z95818 Presence of other cardiac implants and grafts: Secondary | ICD-10-CM | POA: Diagnosis not present

## 2016-10-19 DIAGNOSIS — I5020 Unspecified systolic (congestive) heart failure: Secondary | ICD-10-CM | POA: Diagnosis not present

## 2016-10-19 DIAGNOSIS — I10 Essential (primary) hypertension: Secondary | ICD-10-CM | POA: Diagnosis not present

## 2016-10-25 DIAGNOSIS — I10 Essential (primary) hypertension: Secondary | ICD-10-CM | POA: Diagnosis not present

## 2016-10-25 DIAGNOSIS — E039 Hypothyroidism, unspecified: Secondary | ICD-10-CM | POA: Diagnosis not present

## 2016-10-25 DIAGNOSIS — E119 Type 2 diabetes mellitus without complications: Secondary | ICD-10-CM | POA: Diagnosis not present

## 2016-10-25 DIAGNOSIS — E7800 Pure hypercholesterolemia, unspecified: Secondary | ICD-10-CM | POA: Diagnosis not present

## 2016-10-26 DIAGNOSIS — H353131 Nonexudative age-related macular degeneration, bilateral, early dry stage: Secondary | ICD-10-CM | POA: Diagnosis not present

## 2016-12-28 DIAGNOSIS — E039 Hypothyroidism, unspecified: Secondary | ICD-10-CM | POA: Diagnosis not present

## 2016-12-28 DIAGNOSIS — E119 Type 2 diabetes mellitus without complications: Secondary | ICD-10-CM | POA: Diagnosis not present

## 2016-12-28 DIAGNOSIS — I10 Essential (primary) hypertension: Secondary | ICD-10-CM | POA: Diagnosis not present

## 2016-12-28 DIAGNOSIS — Z0000 Encounter for general adult medical examination without abnormal findings: Secondary | ICD-10-CM | POA: Diagnosis not present

## 2016-12-28 DIAGNOSIS — N390 Urinary tract infection, site not specified: Secondary | ICD-10-CM | POA: Diagnosis not present

## 2016-12-30 DIAGNOSIS — Z95810 Presence of automatic (implantable) cardiac defibrillator: Secondary | ICD-10-CM | POA: Diagnosis not present

## 2016-12-30 DIAGNOSIS — I110 Hypertensive heart disease with heart failure: Secondary | ICD-10-CM | POA: Diagnosis not present

## 2016-12-30 DIAGNOSIS — I10 Essential (primary) hypertension: Secondary | ICD-10-CM | POA: Diagnosis not present

## 2016-12-30 DIAGNOSIS — L309 Dermatitis, unspecified: Secondary | ICD-10-CM | POA: Diagnosis not present

## 2016-12-30 DIAGNOSIS — I472 Ventricular tachycardia: Secondary | ICD-10-CM | POA: Diagnosis not present

## 2016-12-30 DIAGNOSIS — E118 Type 2 diabetes mellitus with unspecified complications: Secondary | ICD-10-CM | POA: Diagnosis not present

## 2016-12-30 DIAGNOSIS — I509 Heart failure, unspecified: Secondary | ICD-10-CM | POA: Diagnosis not present

## 2016-12-30 DIAGNOSIS — E7800 Pure hypercholesterolemia, unspecified: Secondary | ICD-10-CM | POA: Diagnosis not present

## 2016-12-30 DIAGNOSIS — G40909 Epilepsy, unspecified, not intractable, without status epilepticus: Secondary | ICD-10-CM | POA: Diagnosis not present

## 2016-12-30 DIAGNOSIS — Z79899 Other long term (current) drug therapy: Secondary | ICD-10-CM | POA: Diagnosis not present

## 2017-01-07 DIAGNOSIS — I509 Heart failure, unspecified: Secondary | ICD-10-CM | POA: Diagnosis not present

## 2017-03-23 DIAGNOSIS — Z01810 Encounter for preprocedural cardiovascular examination: Secondary | ICD-10-CM | POA: Diagnosis not present

## 2017-03-23 DIAGNOSIS — Z4502 Encounter for adjustment and management of automatic implantable cardiac defibrillator: Secondary | ICD-10-CM | POA: Diagnosis not present

## 2017-03-23 DIAGNOSIS — I2510 Atherosclerotic heart disease of native coronary artery without angina pectoris: Secondary | ICD-10-CM | POA: Diagnosis not present

## 2017-03-23 DIAGNOSIS — I110 Hypertensive heart disease with heart failure: Secondary | ICD-10-CM | POA: Diagnosis not present

## 2017-03-23 DIAGNOSIS — Z01818 Encounter for other preprocedural examination: Secondary | ICD-10-CM | POA: Diagnosis not present

## 2017-03-23 DIAGNOSIS — E119 Type 2 diabetes mellitus without complications: Secondary | ICD-10-CM | POA: Diagnosis not present

## 2017-03-23 DIAGNOSIS — E785 Hyperlipidemia, unspecified: Secondary | ICD-10-CM | POA: Diagnosis not present

## 2017-03-23 DIAGNOSIS — T82111A Breakdown (mechanical) of cardiac pulse generator (battery), initial encounter: Secondary | ICD-10-CM | POA: Diagnosis not present

## 2017-03-23 DIAGNOSIS — R945 Abnormal results of liver function studies: Secondary | ICD-10-CM | POA: Diagnosis not present

## 2017-03-23 DIAGNOSIS — I5022 Chronic systolic (congestive) heart failure: Secondary | ICD-10-CM | POA: Diagnosis not present

## 2017-03-23 DIAGNOSIS — I429 Cardiomyopathy, unspecified: Secondary | ICD-10-CM | POA: Diagnosis not present

## 2017-03-31 DIAGNOSIS — I509 Heart failure, unspecified: Secondary | ICD-10-CM | POA: Diagnosis not present

## 2017-03-31 DIAGNOSIS — Z4502 Encounter for adjustment and management of automatic implantable cardiac defibrillator: Secondary | ICD-10-CM | POA: Diagnosis not present

## 2017-03-31 DIAGNOSIS — Z4509 Encounter for adjustment and management of other cardiac device: Secondary | ICD-10-CM | POA: Diagnosis not present

## 2017-04-18 DIAGNOSIS — H401131 Primary open-angle glaucoma, bilateral, mild stage: Secondary | ICD-10-CM | POA: Diagnosis not present

## 2017-04-25 DIAGNOSIS — I10 Essential (primary) hypertension: Secondary | ICD-10-CM | POA: Diagnosis not present

## 2017-04-25 DIAGNOSIS — H401131 Primary open-angle glaucoma, bilateral, mild stage: Secondary | ICD-10-CM | POA: Diagnosis not present

## 2017-04-25 DIAGNOSIS — E118 Type 2 diabetes mellitus with unspecified complications: Secondary | ICD-10-CM | POA: Diagnosis not present

## 2017-05-02 DIAGNOSIS — H26491 Other secondary cataract, right eye: Secondary | ICD-10-CM | POA: Diagnosis not present

## 2017-05-09 DIAGNOSIS — I10 Essential (primary) hypertension: Secondary | ICD-10-CM | POA: Diagnosis not present

## 2017-05-09 DIAGNOSIS — E7800 Pure hypercholesterolemia, unspecified: Secondary | ICD-10-CM | POA: Diagnosis not present

## 2017-05-09 DIAGNOSIS — E039 Hypothyroidism, unspecified: Secondary | ICD-10-CM | POA: Diagnosis not present

## 2017-05-09 DIAGNOSIS — E119 Type 2 diabetes mellitus without complications: Secondary | ICD-10-CM | POA: Diagnosis not present

## 2017-08-17 DIAGNOSIS — Z4502 Encounter for adjustment and management of automatic implantable cardiac defibrillator: Secondary | ICD-10-CM | POA: Diagnosis not present

## 2017-08-17 DIAGNOSIS — H401131 Primary open-angle glaucoma, bilateral, mild stage: Secondary | ICD-10-CM | POA: Diagnosis not present

## 2017-08-17 DIAGNOSIS — I5022 Chronic systolic (congestive) heart failure: Secondary | ICD-10-CM | POA: Diagnosis not present

## 2017-08-17 DIAGNOSIS — Z95810 Presence of automatic (implantable) cardiac defibrillator: Secondary | ICD-10-CM | POA: Diagnosis not present

## 2017-08-17 DIAGNOSIS — E785 Hyperlipidemia, unspecified: Secondary | ICD-10-CM | POA: Diagnosis not present

## 2017-08-17 DIAGNOSIS — I110 Hypertensive heart disease with heart failure: Secondary | ICD-10-CM | POA: Diagnosis not present

## 2017-08-17 DIAGNOSIS — E119 Type 2 diabetes mellitus without complications: Secondary | ICD-10-CM | POA: Diagnosis not present

## 2017-08-17 DIAGNOSIS — I5020 Unspecified systolic (congestive) heart failure: Secondary | ICD-10-CM | POA: Diagnosis not present

## 2017-08-17 DIAGNOSIS — I2510 Atherosclerotic heart disease of native coronary artery without angina pectoris: Secondary | ICD-10-CM | POA: Diagnosis not present

## 2017-08-17 DIAGNOSIS — I429 Cardiomyopathy, unspecified: Secondary | ICD-10-CM | POA: Diagnosis not present

## 2017-09-05 DIAGNOSIS — I10 Essential (primary) hypertension: Secondary | ICD-10-CM | POA: Diagnosis not present

## 2017-09-05 DIAGNOSIS — E118 Type 2 diabetes mellitus with unspecified complications: Secondary | ICD-10-CM | POA: Diagnosis not present

## 2017-09-05 DIAGNOSIS — E039 Hypothyroidism, unspecified: Secondary | ICD-10-CM | POA: Diagnosis not present

## 2017-09-08 DIAGNOSIS — I10 Essential (primary) hypertension: Secondary | ICD-10-CM | POA: Diagnosis not present

## 2017-09-08 DIAGNOSIS — E7800 Pure hypercholesterolemia, unspecified: Secondary | ICD-10-CM | POA: Diagnosis not present

## 2017-09-08 DIAGNOSIS — E039 Hypothyroidism, unspecified: Secondary | ICD-10-CM | POA: Diagnosis not present

## 2017-09-08 DIAGNOSIS — E119 Type 2 diabetes mellitus without complications: Secondary | ICD-10-CM | POA: Diagnosis not present

## 2017-09-14 DIAGNOSIS — H401131 Primary open-angle glaucoma, bilateral, mild stage: Secondary | ICD-10-CM | POA: Diagnosis not present

## 2017-12-30 DIAGNOSIS — E119 Type 2 diabetes mellitus without complications: Secondary | ICD-10-CM | POA: Diagnosis not present

## 2017-12-30 DIAGNOSIS — E039 Hypothyroidism, unspecified: Secondary | ICD-10-CM | POA: Diagnosis not present

## 2018-01-02 DIAGNOSIS — E039 Hypothyroidism, unspecified: Secondary | ICD-10-CM | POA: Diagnosis not present

## 2018-01-02 DIAGNOSIS — E7800 Pure hypercholesterolemia, unspecified: Secondary | ICD-10-CM | POA: Diagnosis not present

## 2018-01-02 DIAGNOSIS — I10 Essential (primary) hypertension: Secondary | ICD-10-CM | POA: Diagnosis not present

## 2018-01-02 DIAGNOSIS — E119 Type 2 diabetes mellitus without complications: Secondary | ICD-10-CM | POA: Diagnosis not present

## 2018-02-16 DIAGNOSIS — Z95810 Presence of automatic (implantable) cardiac defibrillator: Secondary | ICD-10-CM | POA: Diagnosis not present

## 2018-02-16 DIAGNOSIS — I2510 Atherosclerotic heart disease of native coronary artery without angina pectoris: Secondary | ICD-10-CM | POA: Diagnosis not present

## 2018-02-16 DIAGNOSIS — Z794 Long term (current) use of insulin: Secondary | ICD-10-CM | POA: Diagnosis not present

## 2018-02-16 DIAGNOSIS — I110 Hypertensive heart disease with heart failure: Secondary | ICD-10-CM | POA: Diagnosis not present

## 2018-02-16 DIAGNOSIS — I5022 Chronic systolic (congestive) heart failure: Secondary | ICD-10-CM | POA: Diagnosis not present

## 2018-02-16 DIAGNOSIS — E119 Type 2 diabetes mellitus without complications: Secondary | ICD-10-CM | POA: Diagnosis not present

## 2018-02-16 DIAGNOSIS — I454 Nonspecific intraventricular block: Secondary | ICD-10-CM | POA: Diagnosis not present

## 2018-02-16 DIAGNOSIS — Z888 Allergy status to other drugs, medicaments and biological substances status: Secondary | ICD-10-CM | POA: Diagnosis not present

## 2018-02-16 DIAGNOSIS — Z79899 Other long term (current) drug therapy: Secondary | ICD-10-CM | POA: Diagnosis not present

## 2018-02-16 DIAGNOSIS — I429 Cardiomyopathy, unspecified: Secondary | ICD-10-CM | POA: Diagnosis not present

## 2018-02-16 DIAGNOSIS — E785 Hyperlipidemia, unspecified: Secondary | ICD-10-CM | POA: Diagnosis not present

## 2018-03-15 ENCOUNTER — Other Ambulatory Visit: Payer: Self-pay

## 2018-03-15 ENCOUNTER — Emergency Department (HOSPITAL_COMMUNITY)
Admission: EM | Admit: 2018-03-15 | Discharge: 2018-03-16 | Discharge status: Against Medical Advice | Payer: Medicare Other | Attending: Emergency Medicine | Admitting: Emergency Medicine

## 2018-03-15 ENCOUNTER — Encounter (HOSPITAL_COMMUNITY): Payer: Self-pay | Admitting: Emergency Medicine

## 2018-03-15 DIAGNOSIS — I10 Essential (primary) hypertension: Secondary | ICD-10-CM

## 2018-03-15 HISTORY — DX: Presence of cardiac pacemaker: Z95.0

## 2018-03-15 HISTORY — DX: Presence of automatic (implantable) cardiac defibrillator: Z95.810

## 2018-03-15 LAB — I-STAT CHEM 8, ED
BUN: 32 mg/dL — AB (ref 8–23)
CHLORIDE: 106 mmol/L (ref 98–111)
CREATININE: 0.7 mg/dL (ref 0.44–1.00)
Calcium, Ion: 1.14 mmol/L — ABNORMAL LOW (ref 1.15–1.40)
Glucose, Bld: 109 mg/dL — ABNORMAL HIGH (ref 70–99)
HCT: 41 % (ref 36.0–46.0)
Hemoglobin: 13.9 g/dL (ref 12.0–15.0)
POTASSIUM: 4.8 mmol/L (ref 3.5–5.1)
Sodium: 141 mmol/L (ref 135–145)
TCO2: 27 mmol/L (ref 22–32)

## 2018-03-15 NOTE — ED Notes (Signed)
Results reviewed.  No changes in acuity at this time 

## 2018-03-15 NOTE — ED Provider Notes (Signed)
Patient placed in Quick Look pathway, seen and evaluated   Chief Complaint: elevated BP  HPI:   [name] is a 84 y.o. female who presents to the ED for elevated BP.  Pt reports high blood pressure since yesterday. Family gave her increased dose of BP medicine and reports BP pressure did not change. Pt denies CP, headache or change in vision. Pt's family reports no changes to medications recently. patient denies any symptoms of chest pain, change in vision or other problems. Patient saw her PCP today.   ROS: patient denies any chest pain, shortness of breath, vision change or other problems.   Physical Exam:  BP (!) 197/67 (BP Location: Right Arm)   Pulse 65   Temp 98.2 F (36.8 C) (Oral)   Resp 16   SpO2 98%    Gen: No distress  Neuro: Awake and Alert  Skin: Warm and dry     Initiation of care has begun. The patient has been counseled on the process, plan, and necessity for staying for the completion/evaluation, and the remainder of the medical screening examination    Janne Napoleon, NP 03/15/18 2101    Tegeler, Canary Brim, MD 03/15/18 [phone]

## 2018-03-15 NOTE — ED Triage Notes (Signed)
Pt reports high blood pressure since yesterday. Family gave her increased dose of BP medicine and reports BP pressure did not change. Pt denies CP, headache or change in vision. Pt's family reports no changes to medications recently.

## 2018-03-16 DIAGNOSIS — E119 Type 2 diabetes mellitus without complications: Secondary | ICD-10-CM | POA: Diagnosis not present

## 2018-03-16 DIAGNOSIS — I10 Essential (primary) hypertension: Secondary | ICD-10-CM | POA: Diagnosis not present

## 2018-06-12 DIAGNOSIS — E039 Hypothyroidism, unspecified: Secondary | ICD-10-CM | POA: Diagnosis not present

## 2018-06-12 DIAGNOSIS — E119 Type 2 diabetes mellitus without complications: Secondary | ICD-10-CM | POA: Diagnosis not present

## 2018-06-12 DIAGNOSIS — I10 Essential (primary) hypertension: Secondary | ICD-10-CM | POA: Diagnosis not present

## 2018-06-20 DIAGNOSIS — E7800 Pure hypercholesterolemia, unspecified: Secondary | ICD-10-CM | POA: Diagnosis not present

## 2018-06-20 DIAGNOSIS — E119 Type 2 diabetes mellitus without complications: Secondary | ICD-10-CM | POA: Diagnosis not present

## 2018-06-20 DIAGNOSIS — E669 Obesity, unspecified: Secondary | ICD-10-CM | POA: Diagnosis not present

## 2018-06-20 DIAGNOSIS — I10 Essential (primary) hypertension: Secondary | ICD-10-CM | POA: Diagnosis not present

## 2018-07-03 DIAGNOSIS — M79662 Pain in left lower leg: Secondary | ICD-10-CM | POA: Diagnosis not present

## 2018-07-03 DIAGNOSIS — W19XXXA Unspecified fall, initial encounter: Secondary | ICD-10-CM | POA: Diagnosis not present

## 2018-07-03 DIAGNOSIS — M79605 Pain in left leg: Secondary | ICD-10-CM | POA: Diagnosis not present

## 2018-07-03 DIAGNOSIS — S8992XA Unspecified injury of left lower leg, initial encounter: Secondary | ICD-10-CM | POA: Diagnosis not present

## 2018-07-03 DIAGNOSIS — S99912A Unspecified injury of left ankle, initial encounter: Secondary | ICD-10-CM | POA: Diagnosis not present

## 2018-07-28 DIAGNOSIS — I10 Essential (primary) hypertension: Secondary | ICD-10-CM | POA: Diagnosis not present

## 2018-07-28 DIAGNOSIS — M7989 Other specified soft tissue disorders: Secondary | ICD-10-CM | POA: Diagnosis not present

## 2018-07-28 DIAGNOSIS — E119 Type 2 diabetes mellitus without complications: Secondary | ICD-10-CM | POA: Diagnosis not present

## 2018-07-28 DIAGNOSIS — R42 Dizziness and giddiness: Secondary | ICD-10-CM | POA: Diagnosis not present

## 2018-07-30 DIAGNOSIS — R9431 Abnormal electrocardiogram [ECG] [EKG]: Secondary | ICD-10-CM | POA: Diagnosis not present

## 2018-08-07 DIAGNOSIS — I5022 Chronic systolic (congestive) heart failure: Secondary | ICD-10-CM | POA: Diagnosis not present

## 2018-08-07 DIAGNOSIS — I10 Essential (primary) hypertension: Secondary | ICD-10-CM | POA: Diagnosis not present

## 2018-08-22 DIAGNOSIS — H04121 Dry eye syndrome of right lacrimal gland: Secondary | ICD-10-CM | POA: Diagnosis not present

## 2018-08-22 DIAGNOSIS — H401131 Primary open-angle glaucoma, bilateral, mild stage: Secondary | ICD-10-CM | POA: Diagnosis not present

## 2018-08-29 DIAGNOSIS — E119 Type 2 diabetes mellitus without complications: Secondary | ICD-10-CM | POA: Diagnosis not present

## 2018-09-06 DIAGNOSIS — I10 Essential (primary) hypertension: Secondary | ICD-10-CM | POA: Diagnosis not present

## 2018-09-22 DIAGNOSIS — I509 Heart failure, unspecified: Secondary | ICD-10-CM | POA: Diagnosis not present

## 2018-09-22 DIAGNOSIS — Z4502 Encounter for adjustment and management of automatic implantable cardiac defibrillator: Secondary | ICD-10-CM | POA: Diagnosis not present

## 2018-09-22 DIAGNOSIS — I472 Ventricular tachycardia: Secondary | ICD-10-CM | POA: Diagnosis not present

## 2018-10-16 ENCOUNTER — Other Ambulatory Visit: Payer: Self-pay

## 2018-10-16 NOTE — Patient Outreach (Signed)
[facility]) Care Management  10/16/2018  KALPANA FURMANSKI Dec 02, 1926 [id number]   Medication Adherence call to Mrs Natsha Mcke[place] HIPPA Compliant Voice message left with a call back number. Mrs. Coller is showing past due on Atorvastatin 20 mg under [place] [employer].   Lillia Abed CPhT Pharmacy Technician [facility] Care Management Direct Dial [phone]  Fax [phone] [email or web address]

## 2018-12-13 DIAGNOSIS — E119 Type 2 diabetes mellitus without complications: Secondary | ICD-10-CM | POA: Diagnosis not present

## 2018-12-13 DIAGNOSIS — N390 Urinary tract infection, site not specified: Secondary | ICD-10-CM | POA: Diagnosis not present

## 2018-12-13 DIAGNOSIS — I10 Essential (primary) hypertension: Secondary | ICD-10-CM | POA: Diagnosis not present

## 2018-12-13 DIAGNOSIS — E039 Hypothyroidism, unspecified: Secondary | ICD-10-CM | POA: Diagnosis not present

## 2018-12-13 DIAGNOSIS — E7800 Pure hypercholesterolemia, unspecified: Secondary | ICD-10-CM | POA: Diagnosis not present

## 2018-12-22 DIAGNOSIS — Z4502 Encounter for adjustment and management of automatic implantable cardiac defibrillator: Secondary | ICD-10-CM | POA: Diagnosis not present

## 2018-12-22 DIAGNOSIS — I472 Ventricular tachycardia: Secondary | ICD-10-CM | POA: Diagnosis not present

## 2018-12-25 DIAGNOSIS — I10 Essential (primary) hypertension: Secondary | ICD-10-CM | POA: Diagnosis not present

## 2018-12-25 DIAGNOSIS — E7800 Pure hypercholesterolemia, unspecified: Secondary | ICD-10-CM | POA: Diagnosis not present

## 2018-12-25 DIAGNOSIS — Z0000 Encounter for general adult medical examination without abnormal findings: Secondary | ICD-10-CM | POA: Diagnosis not present

## 2018-12-25 DIAGNOSIS — E039 Hypothyroidism, unspecified: Secondary | ICD-10-CM | POA: Diagnosis not present

## 2018-12-25 DIAGNOSIS — Z780 Asymptomatic menopausal state: Secondary | ICD-10-CM | POA: Diagnosis not present

## 2018-12-25 DIAGNOSIS — E119 Type 2 diabetes mellitus without complications: Secondary | ICD-10-CM | POA: Diagnosis not present

## 2018-12-25 DIAGNOSIS — M8580 Other specified disorders of bone density and structure, unspecified site: Secondary | ICD-10-CM | POA: Diagnosis not present

## 2019-02-07 DIAGNOSIS — I5022 Chronic systolic (congestive) heart failure: Secondary | ICD-10-CM | POA: Diagnosis not present

## 2019-02-07 DIAGNOSIS — I5020 Unspecified systolic (congestive) heart failure: Secondary | ICD-10-CM | POA: Diagnosis not present

## 2019-02-07 DIAGNOSIS — Z95810 Presence of automatic (implantable) cardiac defibrillator: Secondary | ICD-10-CM | POA: Diagnosis not present

## 2019-05-10 DIAGNOSIS — Z4502 Encounter for adjustment and management of automatic implantable cardiac defibrillator: Secondary | ICD-10-CM | POA: Diagnosis not present

## 2019-05-10 DIAGNOSIS — Z95810 Presence of automatic (implantable) cardiac defibrillator: Secondary | ICD-10-CM | POA: Diagnosis not present

## 2019-05-10 DIAGNOSIS — I472 Ventricular tachycardia: Secondary | ICD-10-CM | POA: Diagnosis not present

## 2019-05-10 DIAGNOSIS — I5089 Other heart failure: Secondary | ICD-10-CM | POA: Diagnosis not present

## 2019-05-15 DIAGNOSIS — E119 Type 2 diabetes mellitus without complications: Secondary | ICD-10-CM | POA: Diagnosis not present

## 2019-05-15 DIAGNOSIS — E039 Hypothyroidism, unspecified: Secondary | ICD-10-CM | POA: Diagnosis not present

## 2019-05-15 DIAGNOSIS — I10 Essential (primary) hypertension: Secondary | ICD-10-CM | POA: Diagnosis not present

## 2019-05-21 DIAGNOSIS — H401131 Primary open-angle glaucoma, bilateral, mild stage: Secondary | ICD-10-CM | POA: Diagnosis not present

## 2019-05-22 DIAGNOSIS — E7800 Pure hypercholesterolemia, unspecified: Secondary | ICD-10-CM | POA: Diagnosis not present

## 2019-05-22 DIAGNOSIS — Z0000 Encounter for general adult medical examination without abnormal findings: Secondary | ICD-10-CM | POA: Diagnosis not present

## 2019-05-22 DIAGNOSIS — E119 Type 2 diabetes mellitus without complications: Secondary | ICD-10-CM | POA: Diagnosis not present

## 2019-05-22 DIAGNOSIS — I10 Essential (primary) hypertension: Secondary | ICD-10-CM | POA: Diagnosis not present

## 2019-05-22 DIAGNOSIS — H5213 Myopia, bilateral: Secondary | ICD-10-CM | POA: Diagnosis not present

## 2019-07-10 DIAGNOSIS — H524 Presbyopia: Secondary | ICD-10-CM | POA: Diagnosis not present

## 2019-08-23 ENCOUNTER — Emergency Department (HOSPITAL_COMMUNITY): Payer: Medicare Other

## 2019-08-23 ENCOUNTER — Other Ambulatory Visit: Payer: Self-pay

## 2019-08-23 ENCOUNTER — Emergency Department (HOSPITAL_COMMUNITY)
Admission: EM | Admit: 2019-08-23 | Discharge: 2019-08-23 | Disposition: A | Discharge status: Home / Self Care | Payer: Medicare Other | Attending: Emergency Medicine | Admitting: Emergency Medicine

## 2019-08-23 ENCOUNTER — Encounter (HOSPITAL_COMMUNITY): Payer: Self-pay | Admitting: Emergency Medicine

## 2019-08-23 DIAGNOSIS — R55 Syncope and collapse: Secondary | ICD-10-CM

## 2019-08-23 DIAGNOSIS — R197 Diarrhea, unspecified: Secondary | ICD-10-CM | POA: Diagnosis not present

## 2019-08-23 DIAGNOSIS — Z950 Presence of cardiac pacemaker: Secondary | ICD-10-CM | POA: Diagnosis not present

## 2019-08-23 DIAGNOSIS — Z7982 Long term (current) use of aspirin: Secondary | ICD-10-CM | POA: Diagnosis not present

## 2019-08-23 DIAGNOSIS — E119 Type 2 diabetes mellitus without complications: Secondary | ICD-10-CM | POA: Diagnosis not present

## 2019-08-23 DIAGNOSIS — I509 Heart failure, unspecified: Secondary | ICD-10-CM | POA: Diagnosis not present

## 2019-08-23 DIAGNOSIS — Z79899 Other long term (current) drug therapy: Secondary | ICD-10-CM | POA: Diagnosis not present

## 2019-08-23 DIAGNOSIS — Z20822 Contact with and (suspected) exposure to covid-19: Secondary | ICD-10-CM | POA: Diagnosis not present

## 2019-08-23 DIAGNOSIS — S0990XA Unspecified injury of head, initial encounter: Secondary | ICD-10-CM | POA: Diagnosis not present

## 2019-08-23 DIAGNOSIS — I2510 Atherosclerotic heart disease of native coronary artery without angina pectoris: Secondary | ICD-10-CM | POA: Insufficient documentation

## 2019-08-23 LAB — BASIC METABOLIC PANEL
Anion gap: 8 (ref 5–15)
BUN: 38 mg/dL — ABNORMAL HIGH (ref 8–23)
CO2: 25 mmol/L (ref 22–32)
Calcium: 9.1 mg/dL (ref 8.9–10.3)
Chloride: 108 mmol/L (ref 98–111)
Creatinine, Ser: 0.97 mg/dL (ref 0.44–1.00)
GFR calc Af Amer: 60 mL/min — ABNORMAL LOW (ref >60)
GFR calc non Af Amer: 51 mL/min — ABNORMAL LOW (ref >60)
Glucose, Bld: 168 mg/dL — ABNORMAL HIGH (ref 70–99)
Potassium: 4.3 mmol/L (ref 3.5–5.1)
Sodium: 141 mmol/L (ref 135–145)

## 2019-08-23 LAB — URINALYSIS, ROUTINE W REFLEX MICROSCOPIC
Bilirubin Urine: NEGATIVE
Glucose, UA: NEGATIVE mg/dL
Hgb urine dipstick: NEGATIVE
Ketones, ur: NEGATIVE mg/dL
Nitrite: NEGATIVE
Protein, ur: NEGATIVE mg/dL
Specific Gravity, Urine: 1.01 (ref 1.005–1.030)
pH: 5 (ref 5.0–8.0)

## 2019-08-23 LAB — CBG MONITORING, ED: Glucose-Capillary: 147 mg/dL — ABNORMAL HIGH (ref 70–99)

## 2019-08-23 LAB — TROPONIN I (HIGH SENSITIVITY): Troponin I (High Sensitivity): 7 ng/L (ref <18)

## 2019-08-23 LAB — CBC
HCT: 40.8 % (ref 36.0–46.0)
Hemoglobin: 13.1 g/dL (ref 12.0–15.0)
MCH: 28.7 pg (ref 26.0–34.0)
MCHC: 32.1 g/dL (ref 30.0–36.0)
MCV: 89.5 fL (ref 80.0–100.0)
Platelets: 145 10*3/uL — ABNORMAL LOW (ref 150–400)
RBC: 4.56 MIL/uL (ref 3.87–5.11)
RDW: 15.7 % — ABNORMAL HIGH (ref 11.5–15.5)
WBC: 11.8 10*3/uL — ABNORMAL HIGH (ref 4.0–10.5)
nRBC: 0 % (ref 0.0–0.2)

## 2019-08-23 LAB — POC SARS CORONAVIRUS 2 AG -  ED: SARS Coronavirus 2 Ag: NEGATIVE

## 2019-08-23 MED ORDER — SODIUM CHLORIDE 0.9% FLUSH: 3.0000 mL | Freq: Once | INTRAVENOUS | Status: DC

## 2019-08-23 NOTE — ED Notes (Signed)
Patient verbalizes understanding of discharge instructions. Opportunity for questioning and answers were provided. Armband removed by staff, pt discharged from ED.  

## 2019-08-23 NOTE — ED Notes (Signed)
Received notification from [facility] [employer] in reference to pacemaker interrogation. No significant events noted.

## 2019-08-23 NOTE — ED Triage Notes (Signed)
Pt's dtr states her mother was normal this morning and then at breakfast- 10:30 this morning she passed out at the table. No food in her mouth. Pt passed out a second time after going to the bathroom. Pt was dry heaving like she was going to vomit but never did per family. Pt family also states she has some swelling to left cheek/ and it is painful. Pt did not hit head. Speech is normal per family.

## 2019-08-23 NOTE — ED Provider Notes (Signed)
[facility] EMERGENCY DEPARTMENT Provider Note   CSN: [id number] Arrival date & time: 08/23/19  1745     CC: Near syncope  [name] is a 86 y.o. female w/ hx of CAD, CHF s/p [employer] ICD/Pacemaker (follows at [facility]), HTN, presenting to ED with near syncope.  The patient is Spanish speaking but her daughter provides excellent translation at the bedside.  Her daughter tells me the patient was in her usual state of health this morning.  While sitting at the breakfast table the patient pointed she was feeling lightheaded and then had a near syncopal episode, which is slumped forward against the breakfast table.  She was dazed but did not have complete loss of consciousness for about 5 to 10 minutes.  Afterwards she returned back to her baseline but had 4-5 episodes of diarrhea.  While on her way to the bathroom but the most recent episode, the patient again complained of feeling dizzy and lightheaded and was lowered to the floor by her daughter.  She did not strike her head.  There is no loss of consciousness.  She was again mumbling and confused for about 3 to 5 minutes and then recovered.  She is currently asymptomatic.  Her daughter did not note any tonic-clonic seizure activity.  She has no history of seizures.  During the first episode at the kitchen table, her daughter checked her BP and it was 110/50 mm hg.  Her blood sugar was 124.  Patient denies any chest pain currently or earlier today.  She does not feel lightheaded.  She reports some soreness in her left jawline, but her daughter denies that she hit her head on anything today.  She denies headache.  Her daughter tells me that last time the patient had a similar episode was "many years ago" and subsequently resulted in pacemaker placement.  She's been healthy and fine since then.  HPI     Past Medical History:  Diagnosis Date  . CAD (coronary artery disease)   . DM2 (diabetes mellitus, type 2) (HCC)   .  Edema   . Fall   . HTN (hypertension)   . Pacemaker   . Presence of cardiac defibrillator   . SOB (shortness of breath)   . Syncope     Patient Active Problem List   Diagnosis Date Noted  . PULMONARY NODULE 04/22/2009  . HYPERTHYROIDISM 05/09/2007  . DIABETES MELLITUS, TYPE II 05/09/2007  . HYPERTENSION 05/09/2007  . ALLERGIC RHINITIS 05/09/2007  . GERD 05/09/2007  . KNEE PAIN, CHRONIC 05/09/2007  . LOW BACK PAIN, CHRONIC 05/09/2007  . HEPATITIS B, HX OF 05/09/2007    Past Surgical History:  Procedure Laterality Date  . BACK SURGERY    . EYE SURGERY       OB History   No obstetric history on file.     No family history on file.  Social History   Tobacco Use  . Smoking status: Never Smoker  . Smokeless tobacco: Never Used  Substance Use Topics  . Alcohol use: No  . Drug use: No    Home Medications Prior to Admission medications   Medication Sig Start Date End Date Taking? Authorizing Provider  amLODipine (NORVASC) 5 MG tablet Take 2.5 mg by mouth 3 (three) times daily.     [provider]  aspirin 81 MG tablet Take 81 mg by mouth daily.    [provider]  atorvastatin (LIPITOR) 20 MG tablet Take 20 mg by  mouth daily.    [provider]  cephALEXin (KEFLEX) 500 MG capsule Take 1 capsule (500 mg total) by mouth 3 (three) times daily. 08/04/13   Renne Crigler, PA-C  lisinopril (PRINIVIL,ZESTRIL) 20 MG tablet Take 20 mg by mouth daily.    [provider]  pioglitazone-glimepiride (DUETACT) 30-2 MG per tablet Take 1 tablet by mouth daily with breakfast.    [provider]    Allergies    Patient has no known allergies.  Review of Systems   Review of Systems  Constitutional: Negative for chills and fever.  Cardiovascular: Negative for chest pain and palpitations.  Gastrointestinal: Negative for abdominal pain, nausea and vomiting.  Genitourinary: Negative for dysuria and hematuria.  Musculoskeletal: Negative for  arthralgias, back pain and neck stiffness.  Neurological: Positive for light-headedness. Negative for syncope, facial asymmetry, speech difficulty and headaches.  Psychiatric/Behavioral: Negative for agitation and confusion.  All other systems reviewed and are negative.   Physical Exam Updated Vital Signs BP (!) 136/47   Pulse 60   Temp 98.4 F (36.9 C) (Oral)   Resp 13   SpO2 95%   Physical Exam Vitals and nursing note reviewed.  Constitutional:      General: She is not in acute distress.    Appearance: She is well-developed.  HENT:     Head: Normocephalic and atraumatic.      Comments: Left upper mandibular tenderness without bruising or gross deformity Able to open and close mouth normally, no laxity of jawline  Eyes:     Conjunctiva/sclera: Conjunctivae normal.  Cardiovascular:     Rate and Rhythm: Normal rate and regular rhythm.     Pulses: Normal pulses.  Pulmonary:     Effort: Pulmonary effort is normal. No respiratory distress.  Abdominal:     General: There is no distension.     Palpations: Abdomen is soft.     Tenderness: There is no abdominal tenderness.  Musculoskeletal:     Cervical back: Neck supple.  Skin:    General: Skin is warm and dry.  Neurological:     General: No focal deficit present.     Mental Status: She is alert and oriented to person, place, and time. Mental status is at baseline.     Cranial Nerves: No cranial nerve deficit.     ED Results / Procedures / Treatments   Labs (all labs ordered are listed, but only abnormal results are displayed) Labs Reviewed  BASIC METABOLIC PANEL - Abnormal; Notable for the following components:      Result Value   Glucose, Bld 168 (*)    BUN 38 (*)    GFR calc non Af Amer 51 (*)    GFR calc Af Amer 60 (*)    All other components within normal limits  CBC - Abnormal; Notable for the following components:   WBC 11.8 (*)    RDW 15.7 (*)    Platelets 145 (*)    All other components within normal  limits  URINALYSIS, ROUTINE W REFLEX MICROSCOPIC - Abnormal; Notable for the following components:   APPearance HAZY (*)    Leukocytes,Ua TRACE (*)    Bacteria, UA RARE (*)    All other components within normal limits  CBG MONITORING, ED - Abnormal; Notable for the following components:   Glucose-Capillary 147 (*)    All other components within normal limits  POC SARS CORONAVIRUS 2 AG -  ED  TROPONIN I (HIGH SENSITIVITY)    EKG EKG Interpretation  Date/Time:  Thursday August 23 2019 17:53:20 EST Ventricular Rate:  68 PR Interval:  196 QRS Duration: 110 QT Interval:  452 QTC Calculation: 480 R Axis:   69 Text Interpretation: Atrial-sensed ventricular-paced rhythm Abnormal ECG No STEMI Confirmed by Octaviano Glow [phone]) on 08/23/2019 6:12:33 PM   Radiology CT Head Wo Contrast  Result Date: 08/23/2019 CLINICAL DATA:  86 year old female status post syncope and fall. EXAM: CT HEAD WITHOUT CONTRAST TECHNIQUE: Contiguous axial images were obtained from the base of the skull through the vertex without intravenous contrast. COMPARISON:  Head CT 08/04/2013. Brain MRI 10/06/2008. FINDINGS: Brain: Cerebral volume is stable since 2015 and within normal limits for age. A small chronic lacunar infarct in the right thalamus appears to be stable on series 3, image 15. There is mild for age scattered white matter hypodensity. No midline shift, ventriculomegaly, mass effect, evidence of mass lesion, intracranial hemorrhage or evidence of cortically based acute infarction. Vascular: Calcified atherosclerosis at the skull base. No suspicious intracranial vascular hyperdensity. Skull: Pronounced chronic hyperostosis, stable since 2015. No acute osseous abnormality identified. Sinuses/Orbits: Visualized paranasal sinuses and mastoids are stable and well pneumatized. Other: No acute orbit or scalp soft tissue injury identified. IMPRESSION: 1. No acute intracranial abnormality or acute traumatic injury  identified. 2. Mild for age chronic small vessel disease. Electronically Signed   By: Genevie Ann M.D.   On: 08/23/2019 20:09   DG Chest Portable 1 View  Result Date: 08/23/2019 CLINICAL DATA:  Syncope, dizziness and weakness. EXAM: PORTABLE CHEST 1 VIEW COMPARISON:  02/13/2011. FINDINGS: Stable enlarged cardiac silhouette and left subclavian pacer and AICD leads. Decreased prominence of the pulmonary vasculature and interstitial markings. Small amount of patchy and linear density at the left lung base. Clear right lung. Diffuse osteopenia. Thoracic spine and bilateral shoulder degenerative changes. IMPRESSION: 1. Improved changes of congestive heart failure. 2. Mild left basilar atelectasis or scarring. Superimposed pneumonia cannot be excluded. Electronically Signed   By: Claudie Revering M.D.   On: 08/23/2019 18:58    Procedures Procedures (including critical care time)  Medications Ordered in ED Medications  sodium chloride flush (NS) 0.9 % injection 3 mL (has no administration in time range)    ED Course  I have reviewed the triage vital signs and the nursing notes.  Pertinent labs & imaging results that were available during my care of the patient were reviewed by me and considered in my medical decision making (see chart for details).  86 yo female presenting to the ED with near-syncopal episodes, twice, occurring today at home.  It does not appear she had true LOC.  She was "dazed" and apparently had somewhat of a drop in her usual BP, although her daughter measured BP 110/50 mmhg at home during the first episode, which should not be low enough to cause this kind of episode.    With her diarrhea there may be some orthostasis/dehydration component, we'll check her VS  Also check UA for UTI  We'll check her hgb, ecg, trop, interrogate her [facility] pacemaker DG chest COVID test given her diarrhea today With her reproducbile left jawline ttp, it's possible she did in fact strike her  head on the table or wall, so we'll obtain a CTH    KATELEN LUEPKE was evaluated in Emergency Department on 08/23/2019 for the symptoms described in the history of present illness. She was evaluated in the context of the global COVID-19 pandemic, which necessitated consideration that the patient might be at  risk for infection with the SARS-CoV-2 virus that causes COVID-19. Institutional protocols and algorithms that pertain to the evaluation of patients at risk for COVID-19 are in a state of rapid change based on information released by regulatory bodies including the CDC and federal and state organizations. These policies and algorithms were followed during the patient's care in the ED.   Clinical Course as of Aug 22 2352  Thu Aug 23, 2019  1927 BUN(!): 38 [MT]  1927 Creatinine: 0.97 [MT]  2234 Asymptomatic still.  I discussed pacemaker report with Dr Jacinto Halim, no episodes of VT/VF reported, no acute events today.  Not likely an acute cardiac episode.  There may be some mild orthostasis and dedydration with her elevated BUN, but otherwise I believe it is reasonable to discharge her.  I advised her daughter to call their cardiologist's office tomorrow to have them also review her latest pacemaker data.   [MT]    Clinical Course User Index [MT] Aden Sek, Kermit Balo, MD    Final Clinical Impression(s) / ED Diagnoses Final diagnoses:  Near syncope    Rx / DC Orders ED Discharge Orders    None       Lavergne Hiltunen, Kermit Balo, MD 08/23/19 2354

## 2019-08-23 NOTE — Discharge Instructions (Addendum)
Please call your cardiologist tomorrow to discuss your visit to the ER.  They should be able to review your pacemaker device (report), and will tell you whether they want to see you in the office.  Keep yourself hydrated at home.

## 2019-08-24 DIAGNOSIS — I454 Nonspecific intraventricular block: Secondary | ICD-10-CM | POA: Diagnosis not present

## 2019-08-24 DIAGNOSIS — R6884 Jaw pain: Secondary | ICD-10-CM | POA: Diagnosis not present

## 2019-08-24 DIAGNOSIS — R9431 Abnormal electrocardiogram [ECG] [EKG]: Secondary | ICD-10-CM | POA: Diagnosis not present

## 2019-08-24 DIAGNOSIS — R42 Dizziness and giddiness: Secondary | ICD-10-CM | POA: Diagnosis not present

## 2019-08-24 DIAGNOSIS — R197 Diarrhea, unspecified: Secondary | ICD-10-CM | POA: Diagnosis not present

## 2019-08-24 DIAGNOSIS — I5022 Chronic systolic (congestive) heart failure: Secondary | ICD-10-CM | POA: Diagnosis not present

## 2019-08-25 DIAGNOSIS — R9431 Abnormal electrocardiogram [ECG] [EKG]: Secondary | ICD-10-CM | POA: Diagnosis not present

## 2019-08-25 DIAGNOSIS — I498 Other specified cardiac arrhythmias: Secondary | ICD-10-CM | POA: Diagnosis not present

## 2019-08-25 DIAGNOSIS — I454 Nonspecific intraventricular block: Secondary | ICD-10-CM | POA: Diagnosis not present

## 2019-09-12 DIAGNOSIS — E7800 Pure hypercholesterolemia, unspecified: Secondary | ICD-10-CM | POA: Diagnosis not present

## 2019-09-12 DIAGNOSIS — I10 Essential (primary) hypertension: Secondary | ICD-10-CM | POA: Diagnosis not present

## 2019-09-12 DIAGNOSIS — E039 Hypothyroidism, unspecified: Secondary | ICD-10-CM | POA: Diagnosis not present

## 2019-09-12 DIAGNOSIS — E119 Type 2 diabetes mellitus without complications: Secondary | ICD-10-CM | POA: Diagnosis not present

## 2019-09-18 DIAGNOSIS — H401131 Primary open-angle glaucoma, bilateral, mild stage: Secondary | ICD-10-CM | POA: Diagnosis not present

## 2019-09-25 DIAGNOSIS — H401132 Primary open-angle glaucoma, bilateral, moderate stage: Secondary | ICD-10-CM | POA: Diagnosis not present

## 2019-09-27 ENCOUNTER — Other Ambulatory Visit: Payer: Self-pay | Admitting: Internal Medicine

## 2019-09-27 DIAGNOSIS — E039 Hypothyroidism, unspecified: Secondary | ICD-10-CM | POA: Diagnosis not present

## 2019-09-27 DIAGNOSIS — E119 Type 2 diabetes mellitus without complications: Secondary | ICD-10-CM | POA: Diagnosis not present

## 2019-09-27 DIAGNOSIS — M79651 Pain in right thigh: Secondary | ICD-10-CM

## 2019-09-27 DIAGNOSIS — I10 Essential (primary) hypertension: Secondary | ICD-10-CM | POA: Diagnosis not present

## 2019-09-28 ENCOUNTER — Ambulatory Visit
Admission: RE | Admit: 2019-09-28 | Discharge: 2019-09-28 | Disposition: A | Discharge status: Home / Self Care | Payer: Medicare Other | Source: Ambulatory Visit | Attending: Internal Medicine | Admitting: Internal Medicine

## 2019-09-28 DIAGNOSIS — M79651 Pain in right thigh: Secondary | ICD-10-CM

## 2019-10-10 ENCOUNTER — Other Ambulatory Visit: Payer: Self-pay

## 2019-10-10 ENCOUNTER — Encounter (HOSPITAL_COMMUNITY): Payer: Self-pay

## 2019-10-10 ENCOUNTER — Ambulatory Visit (HOSPITAL_COMMUNITY)
Admission: EM | Admit: 2019-10-10 | Discharge: 2019-10-10 | Disposition: A | Discharge status: Home / Self Care | Payer: Medicare Other | Attending: Family Medicine | Admitting: Family Medicine

## 2019-10-10 ENCOUNTER — Other Ambulatory Visit: Payer: Self-pay | Admitting: Internal Medicine

## 2019-10-10 DIAGNOSIS — E7800 Pure hypercholesterolemia, unspecified: Secondary | ICD-10-CM | POA: Diagnosis not present

## 2019-10-10 DIAGNOSIS — I10 Essential (primary) hypertension: Secondary | ICD-10-CM | POA: Diagnosis not present

## 2019-10-10 DIAGNOSIS — Z1152 Encounter for screening for COVID-19: Secondary | ICD-10-CM | POA: Diagnosis present

## 2019-10-10 DIAGNOSIS — Z20822 Contact with and (suspected) exposure to covid-19: Secondary | ICD-10-CM | POA: Diagnosis not present

## 2019-10-10 DIAGNOSIS — E039 Hypothyroidism, unspecified: Secondary | ICD-10-CM | POA: Diagnosis not present

## 2019-10-10 DIAGNOSIS — Z79899 Other long term (current) drug therapy: Secondary | ICD-10-CM | POA: Diagnosis not present

## 2019-10-10 DIAGNOSIS — E119 Type 2 diabetes mellitus without complications: Secondary | ICD-10-CM | POA: Diagnosis not present

## 2019-10-10 DIAGNOSIS — U071 COVID-19: Secondary | ICD-10-CM | POA: Insufficient documentation

## 2019-10-10 DIAGNOSIS — M79651 Pain in right thigh: Secondary | ICD-10-CM

## 2019-10-10 NOTE — ED Triage Notes (Signed)
Pt is here wanting a COVID test, pt is denying ALL symptoms.

## 2019-10-10 NOTE — Discharge Instructions (Signed)
Your COVID test is pending.  You should self quarantine until the test result is back.    Take Tylenol as needed for fever or discomfort.  Rest and keep yourself hydrated.    Go to the emergency department if you develop shortness of breath, severe diarrhea, high fever not relieved by Tylenol or ibuprofen, or other concerning symptoms.    

## 2019-10-10 NOTE — ED Provider Notes (Signed)
[facility]    CSN: [id number] Arrival date & time: 10/10/19  1617      History   Chief Complaint Chief Complaint  Patient presents with   COVID test    HPI [name] is a 86 y.o. female.   Patient is accompanied by her son-in-law to this visit.  Patient is Spanish-speaking, they deny interpreter services today.  Patient was exposed to Covid from her son who is now hospitalized with it.  She is very anxious that she may have contracted Covid.  Denies headache, cough, shortness of breath, fever, nausea, vomiting, diarrhea, rash, other symptoms.  Per chart review, patient has history of CAD, hypertension, type 2 diabetes, cardiac pacemaker present with defibrillator.  ROS per HPI  The history is provided by the patient and a relative. No language interpreter was used (declined).    Past Medical History:  Diagnosis Date   CAD (coronary artery disease)    DM2 (diabetes mellitus, type 2) (HCC)    Edema    Fall    HTN (hypertension)    Pacemaker    Presence of cardiac defibrillator    SOB (shortness of breath)    Syncope     Patient Active Problem List   Diagnosis Date Noted   PULMONARY NODULE 04/22/2009   HYPERTHYROIDISM 05/09/2007   DIABETES MELLITUS, TYPE II 05/09/2007   HYPERTENSION 05/09/2007   ALLERGIC RHINITIS 05/09/2007   GERD 05/09/2007   KNEE PAIN, CHRONIC 05/09/2007   LOW BACK PAIN, CHRONIC 05/09/2007   HEPATITIS B, HX OF 05/09/2007    Past Surgical History:  Procedure Laterality Date   BACK SURGERY     EYE SURGERY      OB History   No obstetric history on file.      Home Medications    Prior to Admission medications   Medication Sig Start Date End Date Taking? Authorizing Provider  carbamazepine (CARBATROL) 300 MG 12 hr capsule TAKE ONE CAPSULE BY MOUTH EVERY NIGHT AT BEDTIME 12/22/11  Yes [provider]  carvedilol (COREG) 3.125 MG tablet Take by mouth. 07/26/12  Yes [provider]    cephALEXin (KEFLEX) 500 MG capsule Take by mouth. 03/31/17  Yes [provider]  furosemide (LASIX) 40 MG tablet Take by mouth. 11/23/16  Yes [provider]  levothyroxine (SYNTHROID) 75 MCG tablet Take by mouth. 07/15/14  Yes [provider]  sitaGLIPtin (JANUVIA) 100 MG tablet Take by mouth. 06/29/14  Yes [provider]  timolol (TIMOPTIC) 0.5 % ophthalmic solution  02/10/18  Yes [provider]  topiramate (TOPAMAX) 25 MG tablet Take by mouth. 06/29/14  Yes [provider]  amLODipine (NORVASC) 2.5 MG tablet Take by mouth.    [provider]  amLODipine (NORVASC) 2.5 MG tablet Take 2.5 mg by mouth 2 (two) times daily as needed. 10/01/19   [provider]  amLODipine (NORVASC) 5 MG tablet Take 2.5 mg by mouth 3 (three) times daily.     [provider]  aspirin 81 MG tablet Take 81 mg by mouth daily.    [provider]  atorvastatin (LIPITOR) 20 MG tablet Take 20 mg by mouth daily.    [provider]  BETIMOL 0.5 % ophthalmic solution Apply 1 drop to eye 2 (two) times daily. 09/21/19   [provider]  Calcium Carb-Cholecalciferol (OYSTER SHELL CALCIUM) 500-400 MG-UNIT TABS Take by mouth.    [provider]  carvedilol (COREG) 6.25 MG tablet Take 6.25 mg by mouth  2 (two) times daily. 09/01/19   [provider]  cephALEXin (KEFLEX) 500 MG capsule Take 1 capsule (500 mg total) by mouth 3 (three) times daily. 08/04/13   Renne Crigler, PA-C  dapagliflozin propanediol (FARXIGA) 5 MG TABS tablet Take by mouth.    [provider]  FLUoxetine (PROZAC) 20 MG capsule Take 20 mg by mouth every morning. 07/30/19   [provider]  glimepiride (AMARYL) 2 MG tablet Take 2 mg by mouth daily. 10/08/19   [provider]  hydrochlorothiazide (HYDRODIURIL) 25 MG tablet Take by mouth.    [provider]  hydrochlorothiazide (HYDRODIURIL) 25 MG tablet Take 25 mg by  mouth every morning. 09/27/19   [provider]  levothyroxine (SYNTHROID) 75 MCG tablet Take 75 mcg by mouth daily. 09/01/19   [provider]  levothyroxine (SYNTHROID) 88 MCG tablet Take 88 mcg by mouth daily. 08/31/19   [provider]  lisinopril (PRINIVIL,ZESTRIL) 20 MG tablet Take 20 mg by mouth daily.    [provider]  lisinopril (ZESTRIL) 20 MG tablet Take by mouth.    [provider]  omega-3 acid ethyl esters (LOVAZA) 1 g capsule Take 2 capsules by mouth 2 (two) times daily. 09/04/19   [provider]  pioglitazone (ACTOS) 30 MG tablet Take 30 mg by mouth daily. 09/28/19   [provider]  pioglitazone-glimepiride (DUETACT) 30-2 MG per tablet Take 1 tablet by mouth daily with breakfast.    [provider]  timolol (TIMOPTIC) 0.5 % ophthalmic solution 1 drop 2 (two) times daily. 09/07/19   [provider]    Family History Family History  Family history unknown: Yes    Social History Social History   Tobacco Use   Smoking status: Never Smoker   Smokeless tobacco: Never Used  Substance Use Topics   Alcohol use: No   Drug use: No     Allergies   Coconut oil, Lisinopril, Other, Shellfish allergy, and Strawberry extract   Review of Systems Review of Systems   Physical Exam Triage Vital Signs ED Triage Vitals  Enc Vitals Group     BP 10/10/19 1701 (!) 151/52     Pulse Rate 10/10/19 1701 67     Resp 10/10/19 1701 18     Temp 10/10/19 1701 99.1 F (37.3 C)     Temp Source 10/10/19 1701 Oral     SpO2 10/10/19 1701 90 %     Weight 10/10/19 1705 201 lb 9.6 oz (91.4 kg)     Height --      Head Circumference --      Peak Flow --      Pain Score 10/10/19 1656 0     Pain Loc --      Pain Edu? --      Excl. in GC? --    No data found.  Updated Vital Signs BP (!) 151/52 (BP Location: Left Arm)    Pulse 67    Temp 99.1 F (37.3 C) (Oral)    Resp 18    Wt 201 lb 9.6 oz (91.4 kg)    SpO2  90%    BMI 36.87 kg/m   Visual Acuity Right Eye Distance:   Left Eye Distance:   Bilateral Distance:    Right Eye Near:   Left Eye Near:    Bilateral Near:     Physical Exam Vitals and nursing note reviewed.  Constitutional:      General: She is not in acute distress.  Appearance: Normal appearance. She is well-developed. She is obese. She is not ill-appearing.  HENT:     Head: Normocephalic and atraumatic.     Right Ear: Tympanic membrane normal.     Left Ear: Tympanic membrane normal.     Nose: Nose normal.     Mouth/Throat:     Mouth: Mucous membranes are moist.     Pharynx: Oropharynx is clear.  Eyes:     Extraocular Movements: Extraocular movements intact.     Conjunctiva/sclera: Conjunctivae normal.     Pupils: Pupils are equal, round, and reactive to light.  Cardiovascular:     Rate and Rhythm: Normal rate and regular rhythm.     Heart sounds: Normal heart sounds. No murmur.  Pulmonary:     Effort: Pulmonary effort is normal. No respiratory distress.     Breath sounds: Normal breath sounds. No stridor. No wheezing, rhonchi or rales.  Chest:     Chest wall: No tenderness.  Abdominal:     General: Bowel sounds are normal. There is no distension.     Palpations: Abdomen is soft. There is no mass.     Tenderness: There is no abdominal tenderness. There is no right CVA tenderness, left CVA tenderness, guarding or rebound.     Hernia: No hernia is present.  Musculoskeletal:        General: Normal range of motion.     Cervical back: Normal range of motion and neck supple.  Skin:    General: Skin is warm and dry.     Capillary Refill: Capillary refill takes less than 2 seconds.  Neurological:     General: No focal deficit present.     Mental Status: She is alert and oriented to person, place, and time.  Psychiatric:        Mood and Affect: Mood normal.        Behavior: Behavior normal.        Thought Content: Thought content normal.      UC Treatments /  Results  Labs (all labs ordered are listed, but only abnormal results are displayed) Labs Reviewed  SARS CORONAVIRUS 2 (TAT 6-24 HRS)    EKG   Radiology No results found.  Procedures Procedures (including critical care time)  Medications Ordered in UC Medications - No data to display  Initial Impression / Assessment and Plan / UC Course  I have reviewed the triage vital signs and the nursing notes.  Pertinent labs & imaging results that were available during my care of the patient were reviewed by me and considered in my medical decision making (see chart for details).     Exposure to COVID-19: Patient is here for Covid screening.  She is also concerned that she may have contracted Covid from her son.  Physical exam is benign, patient denies all symptoms.  Covid swab obtained, will inform patient when results are back.  They will appear on her MyChart.  Instructed patient that she is to quarantine until results are back and negative.  Instructed patient that if results are positive, she is to stay home and quarantine for the next 14 days.  If results are negative, she may resume her schedule as usual.  Follow-up with the ER for trouble breathing, trouble swallowing, other concerning symptoms. Final Clinical Impressions(s) / UC Diagnoses   Final diagnoses:  Exposure to COVID-19 virus  Encounter for screening for COVID-19     Discharge Instructions     Your COVID test is pending.  You should self quarantine until the test result is back.    Take Tylenol as needed for fever or discomfort.  Rest and keep yourself hydrated.    Go to the emergency department if you develop shortness of breath, severe diarrhea, high fever not relieved by Tylenol or ibuprofen, or other concerning symptoms.       ED Prescriptions    None     PDMP not reviewed this encounter.   Moshe Cipro, NP 10/10/19 1729

## 2019-10-11 LAB — SARS CORONAVIRUS 2 (TAT 6-24 HRS): SARS Coronavirus 2: POSITIVE — AB

## 2019-10-12 ENCOUNTER — Other Ambulatory Visit: Payer: Self-pay | Admitting: Physician Assistant

## 2019-10-12 ENCOUNTER — Other Ambulatory Visit: Payer: Medicare Other

## 2019-10-12 ENCOUNTER — Encounter: Payer: Self-pay | Admitting: Physician Assistant

## 2019-10-12 ENCOUNTER — Telehealth: Payer: Self-pay | Admitting: Physician Assistant

## 2019-10-12 ENCOUNTER — Ambulatory Visit (HOSPITAL_COMMUNITY)
Admission: RE | Admit: 2019-10-12 | Discharge: 2019-10-12 | Disposition: A | Discharge status: Home / Self Care | Payer: Medicare Other | Source: Ambulatory Visit | Attending: Pulmonary Disease | Admitting: Pulmonary Disease

## 2019-10-12 DIAGNOSIS — Z23 Encounter for immunization: Secondary | ICD-10-CM | POA: Insufficient documentation

## 2019-10-12 DIAGNOSIS — I10 Essential (primary) hypertension: Secondary | ICD-10-CM | POA: Insufficient documentation

## 2019-10-12 DIAGNOSIS — U071 COVID-19: Secondary | ICD-10-CM | POA: Diagnosis present

## 2019-10-12 MED ORDER — SODIUM CHLORIDE 0.9 % IV SOLN: INTRAVENOUS | Status: DC | PRN

## 2019-10-12 MED ORDER — DIPHENHYDRAMINE HCL 50 MG/ML IJ SOLN: 50.0000 mg | Freq: Once | INTRAMUSCULAR | Status: DC | PRN

## 2019-10-12 MED ORDER — SODIUM CHLORIDE 0.9 % IV SOLN
Freq: Once | INTRAVENOUS | Status: AC
  Filled 2019-10-12: qty 700

## 2019-10-12 MED ORDER — FAMOTIDINE IN NACL 20-0.9 MG/50ML-% IV SOLN: 20.0000 mg | Freq: Once | INTRAVENOUS | Status: DC | PRN

## 2019-10-12 MED ORDER — METHYLPREDNISOLONE SODIUM SUCC 125 MG IJ SOLR: 125.0000 mg | Freq: Once | INTRAMUSCULAR | Status: DC | PRN

## 2019-10-12 MED ORDER — EPINEPHRINE 0.3 MG/0.3ML IJ SOAJ: 0.3000 mg | Freq: Once | INTRAMUSCULAR | Status: DC | PRN

## 2019-10-12 MED ORDER — ALBUTEROL SULFATE HFA 108 (90 BASE) MCG/ACT IN AERS: 2.0000 | INHALATION_SPRAY | Freq: Once | RESPIRATORY_TRACT | Status: DC | PRN

## 2019-10-12 NOTE — Discharge Instructions (Signed)
COVID-19 COVID-19 El COVID-19 es una infeccin respiratoria causada por un virus llamado coronavirus tipo 2 causante del sndrome respiratorio agudo grave (SARS-CoV-2). La enfermedad tambin se conoce como enfermedad por coronavirus o nuevo coronavirus. En algunas personas, el virus puede no ocasionar sntomas. En otras, puede producir una infeccin grave. La infeccin puede empeorar rpidamente y causar complicaciones, como:  Neumona o infeccin en los pulmones.  Sndrome de dificultad respiratoria aguda o SDRA. Es una afeccin que se caracteriza por la acumulacin de lquido en los pulmones, que impide que los pulmones se llenen de aire y pasen oxgeno a la sangre.  Insuficiencia respiratoria aguda. Es una afeccin que se caracteriza porque no pasa suficiente oxgeno de los pulmones al cuerpo o porque el dixido de carbono no pasa de los pulmones hacia afuera del cuerpo.  Sepsis o choque sptico. Se trata de una reaccin grave del cuerpo ante una infeccin.  Problemas de coagulacin.  Infecciones secundarias debido a bacterias u hongos.  Falla de rganos. Ocurre cuando los rganos del cuerpo dejan de funcionar. El virus que causa el COVID-19 es contagioso. Esto significa que puede transmitirse de una persona a otra a travs de las gotitas de saliva de la tos y de los estornudos (secreciones respiratorias). Cules son las causas? Esta enfermedad es causada por un virus. Usted puede contagiarse con este virus:  Al inspirar las gotitas de una persona infectada. Las gotitas pueden diseminarse cuando una persona respira, habla, canta, tose o estornuda.  Al tocar algo, como una mesa o el picaportes de una puerta, que estuvo expuesto al virus (contaminado) y luego tocarse la boca, nariz o los ojos. Qu incrementa el riesgo? Riesgo de infeccin Es ms probable que se infecte con este virus si:  Se encuentra a una distancia menor a 6 pies (2 metros) de una persona con COVID-19.  Cuida o  vive con una persona infectada con COVID-19.  Pasa tiempo en espacios interiores repletos de gente o vive en viviendas compartidas. Riesgo de enfermedad grave Es ms probable que se enferme gravemente por el virus si:  Tiene 50aos o ms. Cuanto mayor sea su edad, mayor ser el riesgo de tener una enfermedad grave.  Vive en un hogar de ancianos o centro de atencin a largo plazo.  Tiene cncer.  Tiene una enfermedad prolongada (crnica), como las siguientes: ? Enfermedad pulmonar crnica, que incluye la enfermedad pulmonar obstructiva crnica o asma. ? Una enfermedad crnica que disminuye la capacidad del cuerpo para combatir las infecciones (immunocomprometido). ? Enfermedad cardaca, que incluye insuficiencia cardaca, una afeccin que se caracteriza porque las arterias que llegan al corazn se estrechan u obstruyen (arteriopata coronaria) o una enfermedad que provoca que el msculo cardaco se engrose, se debilite o endurezca (miocardiopata). ? Diabetes. ? Enfermedad renal crnica. ? Anemia drepanoctica, una enfermedad que se caracteriza porque los glbulos rojos tienen una forma anormal de "hoz". ? Enfermedad heptica.  Es obeso. Cules son los signos o sntomas? Los sntomas de esta afeccin pueden ser de leves a graves. Los sntomas pueden aparecer en el trmino de 2 a 14 das despus de haber estado expuesto al virus. Incluyen los siguientes:  Fiebre o escalofros.  Tos.  Dificultad para respirar.  Dolores de cabeza, dolores en el cuerpo o dolores musculares.  Secrecin o congestin nasal.  Dolor de garganta.  Nueva prdida del sentido del gusto o del olfato. Algunas personas tambin pueden tener problemas estomacales, como nuseas, vmitos o diarrea. Es posible que otras personas no tengan sntomas de COVID-19.   Cmo se diagnostica? Esta afeccin se puede diagnosticar en funcin de lo siguiente:  Sus signos y sntomas, especialmente si: ? Vive en una zona  donde hay un brote de COVID-19. ? Viaj recientemente a una zona donde el virus es frecuente. ? Cuida o vive con una persona a quien se le diagnostic COVID-19. ? Estuvo expuesto a una persona a la que se le diagnostic COVID-19.  Un examen fsico.  Anlisis de laboratorio que pueden incluir: ? Tomar una muestra de lquido de la parte posterior de la nariz y la garganta (lquido nasofarngeo), la nariz o la garganta, con un hisopo. ? Una muestra de mucosidad de los pulmones (esputo). ? Anlisis de sangre.  Los estudios de diagnstico por imgenes pueden incluir radiografas, exploracin por tomografa computarizada (TC) o ecografa. Cmo se trata? En este momento, no hay ningn medicamento para tratar el COVID-19. Los medicamentos para tratar otras enfermedades se usan a modo de ensayo para comprobar si son eficaces contra el COVID-19. El mdico le informar sobre las maneras de tratar los sntomas. En la mayora de las personas, la infeccin es leve y puede controlarse en el hogar con reposo, lquidos y medicamentos de venta libre. El tratamiento para una infeccin grave suele realizarse en la unidad de cuidados intensivos (UCI) de un hospital. Puede incluir uno o ms de los siguientes. Estos tratamientos se administran hasta que los sntomas mejoran.  Recibir lquidos y medicamentos a travs de una va intravenosa.  Oxgeno complementario. Para administrar oxgeno extra, se utiliza un tubo en la nariz, una mascarilla o una campana de oxgeno.  Colocarlo para que se recueste boca abajo (decbito prono). Esto facilita el ingreso de oxgeno a los pulmones.  Uso continuo de una mquina de presin positiva de las vas areas (CPAP) o de presin positiva de las vas areas de dos niveles (BPAP). Este tratamiento utiliza una presin de aire leve para mantener las vas respiratorias abiertas. Un tubo conectado a un motor administra oxgeno al cuerpo.  Respirador. Este tratamiento mueve el aire  dentro y fuera de los pulmones mediante el uso de un tubo que se coloca en la trquea.  Traqueostoma. En este procedimiento se hace un orificio en el cuello para insertar un tubo de respiracin.  Oxigenacin por membrana extracorprea (OMEC). En este procedimiento, los pulmones tienen la posibilidad de recuperarse al asumir las funciones del corazn y los pulmones. Suministra oxgeno al cuerpo y elimina el dixido de carbono. Siga estas instrucciones en su casa: Estilo de vida  Si est enfermo, qudese en su casa, excepto para obtener atencin mdica. El mdico le indicar cunto tiempo debe quedarse en casa. Llame al mdico antes de buscar atencin mdica.  Haga reposo en su casa como se lo haya indicado el mdico.  No consuma ningn producto que contenga nicotina o tabaco, como cigarrillos, cigarrillos electrnicos y tabaco de mascar. Si necesita ayuda para dejar de fumar, consulte al mdico.  Retome sus actividades normales segn lo indicado por el mdico. Pregntele al mdico qu actividades son seguras para usted. Instrucciones generales  Use los medicamentos de venta libre y los recetados solamente como se lo haya indicado el mdico.  Beba suficiente lquido como para mantener la orina de color amarillo plido.  Concurra a todas las visitas de seguimiento como se lo haya indicado el mdico. Esto es importante. Cmo se evita?  No hay ninguna vacuna que ayude a prevenir la infeccin por COVID-19. Sin embargo, hay medidas que puede tomar para protegerse y proteger a   otras personas de este virus. Para protegerse:   No viaje a zonas donde el COVID-19 sea un riesgo. Las zonas donde se informa la presencia del COVID-19 cambian con frecuencia. Para identificar las zonas de alto riesgo y las restricciones de viaje, consulte el sitio web de viajes de los Centers for Disease Control and Prevention (CDC) (Centros para el Control y la Prevencin de Enfermedades):  wwwnc.cdc.gov/travel/notices  Si vive o debe viajar a una zona donde el COVID-19 es un riesgo, tome precauciones para evitar infecciones. ? Aljese de las personas enfermas. ? Lvese las manos frecuentemente con agua y jabn durante 20segundos. Use desinfectante para manos con alcohol si no dispone de agua y jabn. ? Evite tocarse la boca, la cara, los ojos o la nariz. ? Evite salir de su casa, siga las indicaciones de su estado y de las autoridades sanitarias locales. ? Si debe salir de su casa, use un barbijo de tela o una mascarilla facial. Asegrese de que le cubra la nariz y la boca. ? Evite los espacios interiores repletos de gente. Mantenga una distancia de al menos 6 pies (2 metros) de otras personas. ? Desinfecte los objetos y las superficies que se tocan con frecuencia todos los das. Pueden incluir:  Encimeras y mesas.  Picaportes e interruptores de luz.  Lavabos, fregaderos y grifos.  Aparatos electrnicos tales como telfonos, controles remotos, teclados, computadoras y tabletas. Cmo proteger a los dems: Si tiene sntomas de COVID-19, tome medidas para evitar que el virus se propague a otras personas.  Si cree que tiene una infeccin por COVID-19, comunquese de inmediato con su mdico. Informe al equipo de atencin mdica que cree que puede tener una infeccin por el COVID-19.  Qudese en su casa. Salga de su casa solo para buscar atencin mdica. No utilice el transporte pblico.  No viaje mientras est enfermo.  Lvese las manos frecuentemente con agua y jabn durante 20segundos. Usar desinfectante para manos con alcohol si no dispone de agua y jabn.  Mantngase alejado de quienes vivan con usted. Permita que los miembros de la familia sanos cuiden a los nios y las mascotas, si es posible. Si tiene que cuidar a los nios o las mascotas, lvese las manos con frecuencia y use un barbijo. Si es posible, permanezca en su habitacin, separado de los dems. Utilice un  bao diferente.  Asegrese de que todas las personas que viven en su casa se laven bien las manos y con frecuencia.  Tosa o estornude en un pauelo de papel o sobre su manga o codo. No tosa o estornude al aire ni se cubra la boca o la nariz con la mano.  Use un barbijo de tela o una mascarilla facial. Asegrese de que le cubra la nariz y la boca. Dnde buscar ms informacin  Centers for Disease Control and Prevention (Centros para el Control y la Prevencin de Enfermedades): www.cdc.gov/coronavirus/2019-ncov/index.html  World Health Organization (Organizacin Mundial de la Salud): www.who.int/health-topics/coronavirus Comunquese con un mdico si:  Vive o ha viajado a una zona donde el COVID-19 es un riesgo y tiene sntomas de infeccin.  Ha tenido contacto con alguien que tiene COVID-19 y usted tiene sntomas de infeccin. Solicite ayuda inmediatamente si:  Tiene dificultad para respirar.  Siente dolor u opresin en el pecho.  Experimenta confusin.  Tiene las uas de los dedos y los labios de color azulado.  Tiene dificultad para despertarse.  Los sntomas empeoran. Estos sntomas pueden representar un problema grave que constituye una emergencia. No espere   a ver si los sntomas desaparecen. Solicite atencin mdica de inmediato. Comunquese con el servicio de emergencias de su localidad (911 en los [place]). No conduzca por sus propios medios [facility] hospital. Informe al personal mdico de emergencias si cree que tiene COVID-19. Resumen  El COVID-19 es una infeccin respiratoria causada por un virus. Tambin se conoce como enfermedad por coronavirus o nuevo coronavirus. Puede causar infecciones graves, como neumona, sndrome de dificultad respiratoria aguda, insuficiencia respiratoria aguda o sepsis.  El virus que causa el COVID-19 es contagioso. Esto significa que puede transmitirse de [place] persona a otra a travs de las gotitas que se despiden al respirar, Heritage manager,  cantar, toser y Engineering geologist.  Es ms probable que desarrolle una enfermedad grave si tiene 50 aos o ms, tiene el sistema inmunitario dbil, vive en un hogar de ancianos o tiene una enfermedad crnica.  No hay ningn medicamento para tratar el COVID-19. El mdico le informar sobre las maneras de tratar los sntomas.  Tome medidas para protegerse y Conservator, museum/gallery a los Merchandiser, retail las infecciones. Lvese las manos con frecuencia y desinfecte los objetos y las superficies que se tocan con frecuencia todos [place]. Mantngase alejado de las personas que estn enfermas y use un barbijo si est enfermo. Esta informacin no tiene Theme park manager el consejo del mdico. Asegrese de hacerle al mdico cualquier pregunta que tenga. Document Revised: 04/26/2019 Document Reviewed: 08/19/2018 Elsevier Patient Education  2020 Elsevier Inc. 10 Things You Can Do to Manage Your COVID-19 Symptoms at Home If you have possible or confirmed COVID-19: 1. Stay home from work and school. And stay away from other public places. If you must go out, avoid using any kind of public transportation, ridesharing, or taxis. 2. Monitor your symptoms carefully. If your symptoms get worse, call your healthcare provider immediately. 3. Get rest and stay hydrated. 4. If you have a medical appointment, call the healthcare provider ahead of time and tell them that you have or may have COVID-19. 5. For medical emergencies, call 911 and notify the dispatch personnel that you have or may have COVID-19. 6. Cover your cough and sneezes with a tissue or use the inside of your elbow. 7. Wash your hands often with soap and water for at least 20 seconds or clean your hands with an alcohol-based hand sanitizer that contains at least 60% alcohol. 8. As much as possible, stay in a specific room and away from other people in your home. Also, you should use a separate bathroom, if available. If you need to be around other people in or outside of  the home, wear a mask. 9. Avoid sharing personal items with other people in your household, like dishes, towels, and bedding. 10. Clean all surfaces that are touched often, like counters, tabletops, and doorknobs. Use household cleaning sprays or wipes according to the label instructions. [email or web address] 01/03/2019 This information is not intended to replace advice given to you by your health care provider. Make sure you discuss any questions you have with your health care provider. Document Revised: 06/07/2019 Document Reviewed: 06/07/2019 Elsevier Patient Education  2020 [employer].

## 2019-10-12 NOTE — Telephone Encounter (Signed)
  I connected by phone with [name] on 10/12/2019 at 8:43 AM to discuss the potential use of an new treatment for mild to moderate COVID-19 viral infection in non-hospitalized patients.  This patient is a 86 y.o. female that meets the FDA criteria for Emergency Use Authorization of bamlanivimab/etesevimab or casirivimab/imdevimab.  Has a (+) direct SARS-CoV-2 viral test result  Has mild or moderate COVID-19   Is ? 86 years of age and weighs ? 40 kg  Is NOT hospitalized due to COVID-19  Is NOT requiring oxygen therapy or requiring an increase in baseline oxygen flow rate due to COVID-19  Is within 10 days of symptom onset  Has at least one of the high risk factor(s) for progression to severe COVID-19 and/or hospitalization as defined in EUA.  Specific high risk criteria : >/= 84 yo   I have spoken and communicated the following to the patient or parent/caregiver:  1. FDA has authorized the emergency use of bamlanivimab/etesevimab and casirivimab\imdevimab for the treatment of mild to moderate COVID-19 in adults and pediatric patients with positive results of direct SARS-CoV-2 viral testing who are 51 years of age and older weighing at least 40 kg, and who are at high risk for progressing to severe COVID-19 and/or hospitalization.  2. The significant known and potential risks and benefits of bamlanivimab/etesevimab and casirivimab\imdevimab, and the extent to which such potential risks and benefits are unknown.  3. Information on available alternative treatments and the risks and benefits of those alternatives, including clinical trials.  4. Patients treated with bamlanivimab/etesevimab and casirivimab\imdevimab should continue to self-isolate and use infection control measures (e.g., wear mask, isolate, social distance, avoid sharing personal items, clean and disinfect "high touch" surfaces, and frequent handwashing) according to CDC guidelines.   5. The patient or parent/caregiver  has the option to accept or refuse bamlanivimab/etesevimab or casirivimab\imdevimab .  After reviewing this information with the patient, The patient agreed to proceed with receiving the bamlanivimab/etesevimab infusion and will be provided a copy of the Fact sheet prior to receiving the infusion.  Sx onset 10/10/19. Set up for infusion today 10/12/19 @ 12:30pm  Cline Crock 10/12/2019 8:43 AM

## 2019-10-12 NOTE — Progress Notes (Signed)
  Diagnosis: COVID-19  Physician: Dr. Wright  Procedure: [facility] Med: bamlanivimab\etesevimab infusion - Provided patient with bamlanimivab\etesevimab fact sheet for patients, parents and caregivers prior to infusion.  Complications: No immediate complications noted.  Discharge: Discharged home   Wynema Garoutte 10/12/2019   

## 2019-11-09 DIAGNOSIS — H401132 Primary open-angle glaucoma, bilateral, moderate stage: Secondary | ICD-10-CM | POA: Diagnosis not present

## 2019-12-07 DIAGNOSIS — H401132 Primary open-angle glaucoma, bilateral, moderate stage: Secondary | ICD-10-CM | POA: Diagnosis not present

## 2020-01-27 ENCOUNTER — Encounter (HOSPITAL_COMMUNITY): Payer: Self-pay | Admitting: Emergency Medicine

## 2020-01-27 ENCOUNTER — Emergency Department (HOSPITAL_COMMUNITY): Payer: Medicare HMO

## 2020-01-27 ENCOUNTER — Other Ambulatory Visit: Payer: Self-pay

## 2020-01-27 ENCOUNTER — Emergency Department (HOSPITAL_COMMUNITY)
Admission: EM | Admit: 2020-01-27 | Discharge: 2020-01-27 | Disposition: A | Discharge status: Home / Self Care | Payer: Medicare HMO | Attending: Emergency Medicine | Admitting: Emergency Medicine

## 2020-01-27 DIAGNOSIS — M19012 Primary osteoarthritis, left shoulder: Secondary | ICD-10-CM | POA: Diagnosis not present

## 2020-01-27 DIAGNOSIS — S42302A Unspecified fracture of shaft of humerus, left arm, initial encounter for closed fracture: Secondary | ICD-10-CM | POA: Diagnosis not present

## 2020-01-27 DIAGNOSIS — Z5321 Procedure and treatment not carried out due to patient leaving prior to being seen by health care provider: Secondary | ICD-10-CM | POA: Insufficient documentation

## 2020-01-27 DIAGNOSIS — S41112A Laceration without foreign body of left upper arm, initial encounter: Secondary | ICD-10-CM | POA: Diagnosis not present

## 2020-01-27 DIAGNOSIS — W010XXA Fall on same level from slipping, tripping and stumbling without subsequent striking against object, initial encounter: Secondary | ICD-10-CM | POA: Insufficient documentation

## 2020-01-27 DIAGNOSIS — S0990XA Unspecified injury of head, initial encounter: Secondary | ICD-10-CM | POA: Insufficient documentation

## 2020-01-27 DIAGNOSIS — Y999 Unspecified external cause status: Secondary | ICD-10-CM | POA: Diagnosis not present

## 2020-01-27 DIAGNOSIS — Y9389 Activity, other specified: Secondary | ICD-10-CM | POA: Insufficient documentation

## 2020-01-27 DIAGNOSIS — Y92003 Bedroom of unspecified non-institutional (private) residence as the place of occurrence of the external cause: Secondary | ICD-10-CM | POA: Diagnosis not present

## 2020-01-27 NOTE — ED Triage Notes (Signed)
Pt presents to ED POV. Pt c/o L arm lac and pain after falling this yesterday morning. Pt reports she was trying to get out of bed and slipped. Pt has chronic deformity to arm from previous break but new bruise and lac. Pt did hit head, no LOC. Daughter reports she thinks she takes blood thinners but none on med list.   Spoke with Dr. Bebe Shaggy and CN, not activating as trauma

## 2020-01-27 NOTE — ED Notes (Signed)
No answer multiple attempts

## 2020-02-18 DIAGNOSIS — E119 Type 2 diabetes mellitus without complications: Secondary | ICD-10-CM | POA: Diagnosis not present

## 2020-02-18 DIAGNOSIS — E118 Type 2 diabetes mellitus with unspecified complications: Secondary | ICD-10-CM | POA: Diagnosis not present

## 2020-02-18 DIAGNOSIS — E039 Hypothyroidism, unspecified: Secondary | ICD-10-CM | POA: Diagnosis not present

## 2020-02-18 DIAGNOSIS — I10 Essential (primary) hypertension: Secondary | ICD-10-CM | POA: Diagnosis not present

## 2020-02-18 DIAGNOSIS — E7800 Pure hypercholesterolemia, unspecified: Secondary | ICD-10-CM | POA: Diagnosis not present

## 2020-05-05 DIAGNOSIS — I509 Heart failure, unspecified: Secondary | ICD-10-CM | POA: Diagnosis not present

## 2020-05-05 DIAGNOSIS — R0602 Shortness of breath: Secondary | ICD-10-CM | POA: Diagnosis not present

## 2020-05-05 DIAGNOSIS — I10 Essential (primary) hypertension: Secondary | ICD-10-CM | POA: Diagnosis not present

## 2020-05-05 DIAGNOSIS — R0902 Hypoxemia: Secondary | ICD-10-CM | POA: Diagnosis not present

## 2020-05-05 DIAGNOSIS — R482 Apraxia: Secondary | ICD-10-CM | POA: Diagnosis not present

## 2020-05-05 DIAGNOSIS — E039 Hypothyroidism, unspecified: Secondary | ICD-10-CM | POA: Diagnosis not present

## 2020-05-05 DIAGNOSIS — I5022 Chronic systolic (congestive) heart failure: Secondary | ICD-10-CM | POA: Diagnosis not present

## 2020-05-05 DIAGNOSIS — E7800 Pure hypercholesterolemia, unspecified: Secondary | ICD-10-CM | POA: Diagnosis not present

## 2020-05-07 DIAGNOSIS — R0902 Hypoxemia: Secondary | ICD-10-CM | POA: Diagnosis not present

## 2020-05-07 DIAGNOSIS — R482 Apraxia: Secondary | ICD-10-CM | POA: Diagnosis not present

## 2020-05-07 DIAGNOSIS — I5022 Chronic systolic (congestive) heart failure: Secondary | ICD-10-CM | POA: Diagnosis not present

## 2020-05-12 DIAGNOSIS — H401132 Primary open-angle glaucoma, bilateral, moderate stage: Secondary | ICD-10-CM | POA: Diagnosis not present

## 2020-05-16 DIAGNOSIS — I498 Other specified cardiac arrhythmias: Secondary | ICD-10-CM | POA: Diagnosis not present

## 2020-05-16 DIAGNOSIS — I493 Ventricular premature depolarization: Secondary | ICD-10-CM | POA: Diagnosis not present

## 2020-05-16 DIAGNOSIS — I110 Hypertensive heart disease with heart failure: Secondary | ICD-10-CM | POA: Diagnosis not present

## 2020-05-16 DIAGNOSIS — I5022 Chronic systolic (congestive) heart failure: Secondary | ICD-10-CM | POA: Diagnosis not present

## 2020-05-16 DIAGNOSIS — Z95810 Presence of automatic (implantable) cardiac defibrillator: Secondary | ICD-10-CM | POA: Diagnosis not present

## 2020-05-26 DIAGNOSIS — Z950 Presence of cardiac pacemaker: Secondary | ICD-10-CM | POA: Diagnosis not present

## 2020-05-26 DIAGNOSIS — Z0000 Encounter for general adult medical examination without abnormal findings: Secondary | ICD-10-CM | POA: Diagnosis not present

## 2020-05-26 DIAGNOSIS — E119 Type 2 diabetes mellitus without complications: Secondary | ICD-10-CM | POA: Diagnosis not present

## 2020-05-26 DIAGNOSIS — I10 Essential (primary) hypertension: Secondary | ICD-10-CM | POA: Diagnosis not present

## 2020-05-26 DIAGNOSIS — E039 Hypothyroidism, unspecified: Secondary | ICD-10-CM | POA: Diagnosis not present

## 2020-05-26 DIAGNOSIS — E7800 Pure hypercholesterolemia, unspecified: Secondary | ICD-10-CM | POA: Diagnosis not present

## 2020-05-26 DIAGNOSIS — I5022 Chronic systolic (congestive) heart failure: Secondary | ICD-10-CM | POA: Diagnosis not present

## 2020-06-06 DIAGNOSIS — R482 Apraxia: Secondary | ICD-10-CM | POA: Diagnosis not present

## 2020-06-06 DIAGNOSIS — R0902 Hypoxemia: Secondary | ICD-10-CM | POA: Diagnosis not present

## 2020-06-06 DIAGNOSIS — I5022 Chronic systolic (congestive) heart failure: Secondary | ICD-10-CM | POA: Diagnosis not present

## 2020-06-21 ENCOUNTER — Ambulatory Visit (INDEPENDENT_AMBULATORY_CARE_PROVIDER_SITE_OTHER): Payer: Medicare HMO

## 2020-06-21 ENCOUNTER — Encounter (HOSPITAL_COMMUNITY): Payer: Self-pay | Admitting: *Deleted

## 2020-06-21 ENCOUNTER — Other Ambulatory Visit: Payer: Self-pay

## 2020-06-21 ENCOUNTER — Ambulatory Visit (HOSPITAL_COMMUNITY)
Admission: EM | Admit: 2020-06-21 | Discharge: 2020-06-21 | Disposition: A | Discharge status: Home / Self Care | Payer: Medicare HMO | Attending: Emergency Medicine | Admitting: Emergency Medicine

## 2020-06-21 DIAGNOSIS — M545 Low back pain, unspecified: Secondary | ICD-10-CM

## 2020-06-21 DIAGNOSIS — S92415A Nondisplaced fracture of proximal phalanx of left great toe, initial encounter for closed fracture: Secondary | ICD-10-CM

## 2020-06-21 DIAGNOSIS — L03115 Cellulitis of right lower limb: Secondary | ICD-10-CM

## 2020-06-21 DIAGNOSIS — M7989 Other specified soft tissue disorders: Secondary | ICD-10-CM | POA: Diagnosis not present

## 2020-06-21 DIAGNOSIS — M79675 Pain in left toe(s): Secondary | ICD-10-CM | POA: Diagnosis not present

## 2020-06-21 DIAGNOSIS — M533 Sacrococcygeal disorders, not elsewhere classified: Secondary | ICD-10-CM | POA: Diagnosis not present

## 2020-06-21 DIAGNOSIS — M47816 Spondylosis without myelopathy or radiculopathy, lumbar region: Secondary | ICD-10-CM | POA: Diagnosis not present

## 2020-06-21 DIAGNOSIS — M47817 Spondylosis without myelopathy or radiculopathy, lumbosacral region: Secondary | ICD-10-CM | POA: Diagnosis not present

## 2020-06-21 MED ORDER — DOXYCYCLINE HYCLATE 100 MG PO CAPS: 100.0000 mg | ORAL_CAPSULE | Freq: Two times a day (BID) | ORAL | 0 refills | Status: AC

## 2020-06-21 NOTE — Discharge Instructions (Addendum)
Your left big toe is broken. Call [facility] to schedule an appointment. Address: [address] [place], [place] [address]. Telephone number: [phone].    The x-ray of your sacrum/coccyx is negative.    Take the antibiotic as directed for the infection of your lower leg. Follow-up with your primary care provider for a recheck of this area next week.

## 2020-06-21 NOTE — ED Provider Notes (Signed)
[facility]    CSN: [id number] Arrival date & time: 06/21/20  1510      History   Chief Complaint Chief Complaint  Patient presents with  . Toe Injury  . Hip Pain    RT    HPI [name] is a 86 y.o. female.   Accompanied by her daughter, patient presents with pain in her tailbone and right buttock after falling 1 month ago.  She also reports pain in her left great toe since the fall but worse today after walking with her family.  She also reports redness around a wound on her right lower leg that occurred when she fell.  She denies head injury or loss of consciousness with the fall.  She denies numbness, unusual weakness, chest pain, shortness of breath, abdominal pain, or other symptoms.  OTC treatment attempted at home.  Her medical history includes hypertension, CAD, pacemaker, pulmonary nodule, diabetes, syncope, edema, chronic low back pain, chronic knee pain, hepatitis B, GERD.  The history is provided by the patient, a relative and medical records.    Past Medical History:  Diagnosis Date  . CAD (coronary artery disease)   . DM2 (diabetes mellitus, type 2) (HCC)   . Edema   . Fall   . HTN (hypertension)   . Pacemaker   . Presence of cardiac defibrillator   . Syncope     Patient Active Problem List   Diagnosis Date Noted  . PULMONARY NODULE 04/22/2009  . HYPERTHYROIDISM 05/09/2007  . DIABETES MELLITUS, TYPE II 05/09/2007  . HYPERTENSION 05/09/2007  . ALLERGIC RHINITIS 05/09/2007  . GERD 05/09/2007  . KNEE PAIN, CHRONIC 05/09/2007  . LOW BACK PAIN, CHRONIC 05/09/2007  . HEPATITIS B, HX OF 05/09/2007    Past Surgical History:  Procedure Laterality Date  . BACK SURGERY    . EYE SURGERY      OB History   No obstetric history on file.      Home Medications    Prior to Admission medications   Medication Sig Start Date End Date Taking? Authorizing Provider  amLODipine (NORVASC) 2.5 MG tablet Take by mouth.   Yes [provider]   amLODipine (NORVASC) 2.5 MG tablet Take 2.5 mg by mouth 2 (two) times daily as needed. 10/01/19  Yes [provider]  aspirin 81 MG tablet Take 81 mg by mouth daily.   Yes [provider]  atorvastatin (LIPITOR) 20 MG tablet Take 20 mg by mouth daily.   Yes [provider]  BETIMOL 0.5 % ophthalmic solution Apply 1 drop to eye 2 (two) times daily. 09/21/19  Yes [provider]  Calcium Carb-Cholecalciferol (OYSTER SHELL CALCIUM) 500-400 MG-UNIT TABS Take by mouth.   Yes [provider]  carbamazepine (CARBATROL) 300 MG 12 hr capsule TAKE ONE CAPSULE BY MOUTH EVERY NIGHT AT BEDTIME 12/22/11  Yes [provider]  carvedilol (COREG) 3.125 MG tablet Take by mouth. 07/26/12  Yes [provider]  carvedilol (COREG) 6.25 MG tablet Take 6.25 mg by mouth 2 (two) times daily. 09/01/19  Yes [provider]  dapagliflozin propanediol (FARXIGA) 5 MG TABS tablet Take by mouth.   Yes [provider]  FLUoxetine (PROZAC) 20 MG capsule Take 20 mg by mouth every morning. 07/30/19  Yes [provider]  furosemide (LASIX) 40 MG tablet Take by mouth. 11/23/16  Yes [provider]  glimepiride (AMARYL) 2 MG tablet Take 2 mg by mouth daily. 10/08/19  Yes [provider]  hydrochlorothiazide (HYDRODIURIL) 25 MG tablet Take by mouth.   Yes [provider]  omega-3 acid ethyl esters (LOVAZA) 1 g capsule Take 2 capsules by mouth 2 (two) times daily. 09/04/19  Yes [provider]  pioglitazone (ACTOS) 30 MG tablet Take 30 mg by mouth daily. 09/28/19  Yes [provider]  pioglitazone-glimepiride (DUETACT) 30-2 MG per tablet Take 1 tablet by mouth daily with breakfast.   Yes [provider]  sitaGLIPtin (JANUVIA) 100 MG tablet Take by mouth. 06/29/14  Yes [provider]  timolol (TIMOPTIC) 0.5 % ophthalmic solution  02/10/18  Yes [provider]  timolol (TIMOPTIC) 0.5 %  ophthalmic solution 1 drop 2 (two) times daily. 09/07/19  Yes [provider]  topiramate (TOPAMAX) 25 MG tablet Take by mouth. 06/29/14  Yes [provider]  amLODipine (NORVASC) 5 MG tablet Take 2.5 mg by mouth 3 (three) times daily.     [provider]  doxycycline (VIBRAMYCIN) 100 MG capsule Take 1 capsule (100 mg total) by mouth 2 (two) times daily for 7 days. 06/21/20 06/28/20  Mickie Bail, NP  hydrochlorothiazide (HYDRODIURIL) 25 MG tablet Take 25 mg by mouth every morning. 09/27/19   [provider]  levothyroxine (SYNTHROID) 75 MCG tablet Take by mouth. 07/15/14   [provider]  levothyroxine (SYNTHROID) 75 MCG tablet Take 75 mcg by mouth daily. 09/01/19   [provider]  levothyroxine (SYNTHROID) 88 MCG tablet Take 88 mcg by mouth daily. 08/31/19   [provider]  lisinopril (PRINIVIL,ZESTRIL) 20 MG tablet Take 20 mg by mouth daily.    [provider]  lisinopril (ZESTRIL) 20 MG tablet Take by mouth.    [provider]    Family History Family History  Family history unknown: Yes    Social History Social History   Tobacco Use  . Smoking status: Never Smoker  . Smokeless tobacco: Never Used  Substance Use Topics  . Alcohol use: No  . Drug use: No     Allergies   Coconut oil, Lisinopril, Other, Shellfish allergy, and Strawberry extract   Review of Systems Review of Systems  Constitutional: Negative for chills and fever.  HENT: Negative for ear pain and sore throat.   Eyes: Negative for pain and visual disturbance.  Respiratory: Negative for cough and shortness of breath.   Cardiovascular: Negative for chest pain and palpitations.  Gastrointestinal: Negative for abdominal pain and vomiting.  Genitourinary: Negative for dysuria and hematuria.  Musculoskeletal: Positive for arthralgias and back pain.  Skin: Negative for color change and rash.  Neurological: Negative for seizures, syncope,  weakness and numbness.  All other systems reviewed and are negative.    Physical Exam Triage Vital Signs ED Triage Vitals  Enc Vitals Group     BP      Pulse      Resp      Temp      Temp src      SpO2      Weight      Height      Head Circumference      Peak Flow      Pain Score      Pain Loc      Pain Edu?      Excl. in GC?    No data found.  Updated Vital Signs BP (!) 166/54 (BP Location: Left Arm)   Pulse 63   Temp 97.6 F (36.4 C) (Oral)   SpO2 97%  Visual Acuity Right Eye Distance:   Left Eye Distance:   Bilateral Distance:    Right Eye Near:   Left Eye Near:    Bilateral Near:     Physical Exam Vitals and nursing note reviewed.  Constitutional:      General: She is not in acute distress.    Appearance: She is well-developed and well-nourished. She is obese.  HENT:     Head: Normocephalic and atraumatic.     Mouth/Throat:     Mouth: Mucous membranes are moist.  Eyes:     Conjunctiva/sclera: Conjunctivae normal.  Cardiovascular:     Rate and Rhythm: Normal rate and regular rhythm.     Heart sounds: Normal heart sounds.  Pulmonary:     Effort: Pulmonary effort is normal. No respiratory distress.     Breath sounds: Normal breath sounds.  Abdominal:     Palpations: Abdomen is soft.     Tenderness: There is no abdominal tenderness. There is no guarding or rebound.  Musculoskeletal:        General: Tenderness present. No swelling, deformity or edema. Normal range of motion.     Cervical back: Neck supple.       Back:       Legs:       Feet:     Comments: Right hip nontender.  Skin:    General: Skin is warm and dry.     Capillary Refill: Capillary refill takes less than 2 seconds.     Findings: Bruising and erythema present. No lesion.  Neurological:     Mental Status: She is alert. Mental status is at baseline.     Sensory: No sensory deficit.     Motor: No weakness.     Gait: Gait abnormal.     Comments: In wheelchair but able to  stand with assistance.   Psychiatric:        Mood and Affect: Mood and affect and mood normal.        Behavior: Behavior normal.      UC Treatments / Results  Labs (all labs ordered are listed, but only abnormal results are displayed) Labs Reviewed - No data to display  EKG   Radiology DG Sacrum/Coccyx  Result Date: 06/21/2020 CLINICAL DATA:  Lower back and buttock pain for 1 month, pain with weight-bearing EXAM: SACRUM AND COCCYX - 2+ VIEW COMPARISON:  None. FINDINGS: Frontal and lateral views of the sacrum and coccyx are obtained. There are no acute displaced fractures. Sacroiliac joints are normal. Moderate osteitis pubis. Significant spondylosis and facet hypertrophy at the lumbosacral junction. IMPRESSION: 1. Unremarkable sacrum and coccyx. 2. Prominent degenerative changes of the lower lumbar spine and pubic symphysis. Electronically Signed   By: Sharlet Salina M.D.   On: 06/21/2020 18:19   DG Toe Great Left  Result Date: 06/21/2020 CLINICAL DATA:  Toe pain EXAM: LEFT GREAT TOE COMPARISON:  None. FINDINGS: There is a subtle lucency coursing through the head of the proximal phalanx of the first digit. There is mild surrounding soft tissue swelling. There is no dislocation. IMPRESSION: Questionable nondisplaced fracture through the head of the proximal phalanx of the first digit. Electronically Signed   By: Katherine Mantle M.D.   On: 06/21/2020 18:15    Procedures Procedures (including critical care time)  Medications Ordered in UC Medications - No data to display  Initial Impression / Assessment and Plan / UC Course  I have reviewed the triage vital signs and the nursing notes.  Pertinent  labs & imaging results that were available during my care of the patient were reviewed by me and considered in my medical decision making (see chart for details).   Fracture of the proximal phalanx of left great toe.  Acute right lower back pain.  Cellulitis of the right lower leg.   Treating fractured great toe with postop shoe.  Instructed patient and her daughter to follow-up with [facility] on Monday.  Tylenol as needed for discomfort.  Treating right lower leg cellulitis with doxycycline.  Instructed patient and her daughter to have the wound rechecked by her PCP next week.  They agree to plan of care.   Final Clinical Impressions(s) / UC Diagnoses   Final diagnoses:  Closed nondisplaced fracture of proximal phalanx of left great toe, initial encounter  Acute right-sided low back pain without sciatica  Cellulitis of right lower leg     Discharge Instructions     Your left big toe is broken. Call [facility] to schedule an appointment. Address: [address], [place], [place] [address]. Telephone number: [phone].    The x-ray of your sacrum/coccyx is negative.    Take the antibiotic as directed for the infection of your lower leg. Follow-up with your primary care provider for a recheck of this area next week.    ED Prescriptions    Medication Sig Dispense Auth. Provider   doxycycline (VIBRAMYCIN) 100 MG capsule Take 1 capsule (100 mg total) by mouth 2 (two) times daily for 7 days. 14 capsule Mickie Bail, NP     PDMP not reviewed this encounter.   Mickie Bail, NP 06/21/20 [phone]

## 2020-06-21 NOTE — ED Triage Notes (Signed)
PT has swelling to Lt great to after walking a block with family on Friday. Family also reports Pt fell one month ago and now Pt has RT hip pain. Pt also has a wound to RT lower anterior leg that occurred a moth ago with the fall.

## 2020-06-26 ENCOUNTER — Ambulatory Visit (INDEPENDENT_AMBULATORY_CARE_PROVIDER_SITE_OTHER): Payer: Medicare HMO

## 2020-06-26 ENCOUNTER — Ambulatory Visit (INDEPENDENT_AMBULATORY_CARE_PROVIDER_SITE_OTHER): Payer: Medicare HMO | Admitting: Podiatry

## 2020-06-26 ENCOUNTER — Other Ambulatory Visit: Payer: Self-pay

## 2020-06-26 DIAGNOSIS — S92402A Displaced unspecified fracture of left great toe, initial encounter for closed fracture: Secondary | ICD-10-CM | POA: Diagnosis not present

## 2020-06-26 DIAGNOSIS — S92415A Nondisplaced fracture of proximal phalanx of left great toe, initial encounter for closed fracture: Secondary | ICD-10-CM | POA: Diagnosis not present

## 2020-06-29 NOTE — Progress Notes (Signed)
  Subjective:  Patient ID: [name], female    DOB: June 22, 1927,  MRN: [id number]  Chief Complaint  Patient presents with  . TOE FRACTURE    PT STATES SHE HAS TOE FRACTURE TO HER LEFT GREAT TOE HAPPENED ABOUT 7-10 DAYS AGO     86 y.o. female presents with the above complaint. History confirmed with patient. Here with a family friend. There is a Nurse, learning disability that is translating from [place] to [place] as well. Has been elevating the foot and using icy hot cream which is helped. She is also taking antibiotics from her primary care doctor.  Objective:  Physical Exam: warm, good capillary refill, no trophic changes or ulcerative lesions, normal DP and PT pulses and normal sensory exam. Left Foot: Pain on palpation to the hallux; no ecchymosis or gross instability, pain with IPJ motion   Radiographs: X-ray of the left foot: Nondisplaced fracture of proximal phalanx of left hallux Assessment:   1. Closed nondisplaced fracture of proximal phalanx of left great toe, initial encounter      Plan:  Patient was evaluated and treated and all questions answered.   Recommended nonoperative treatment. Discussed WBAT in a CAM boot or surgical shoe. I think this would be more difficult for her to walk and put her at risk for falls. She states she is comfortable in the shoes that she has. Advised this may take 5 to 6 weeks to completely feel better but should resolve uneventfully. Possibly she could develop arthritis in the IPJ at some point but not concerned about the significant with her age. Reviewed RICE protocol. Tylenol as needed over-the-counter for pain.  Return if symptoms worsen or fail to improve.

## 2020-06-30 DIAGNOSIS — M7989 Other specified soft tissue disorders: Secondary | ICD-10-CM | POA: Diagnosis not present

## 2020-06-30 DIAGNOSIS — E119 Type 2 diabetes mellitus without complications: Secondary | ICD-10-CM | POA: Diagnosis not present

## 2020-06-30 DIAGNOSIS — L0390 Cellulitis, unspecified: Secondary | ICD-10-CM | POA: Diagnosis not present

## 2020-06-30 DIAGNOSIS — L989 Disorder of the skin and subcutaneous tissue, unspecified: Secondary | ICD-10-CM | POA: Diagnosis not present

## 2020-07-07 DIAGNOSIS — R482 Apraxia: Secondary | ICD-10-CM | POA: Diagnosis not present

## 2020-07-07 DIAGNOSIS — R0902 Hypoxemia: Secondary | ICD-10-CM | POA: Diagnosis not present

## 2020-07-07 DIAGNOSIS — I5022 Chronic systolic (congestive) heart failure: Secondary | ICD-10-CM | POA: Diagnosis not present

## 2020-08-07 DIAGNOSIS — I5022 Chronic systolic (congestive) heart failure: Secondary | ICD-10-CM | POA: Diagnosis not present

## 2020-08-07 DIAGNOSIS — R482 Apraxia: Secondary | ICD-10-CM | POA: Diagnosis not present

## 2020-08-07 DIAGNOSIS — R0902 Hypoxemia: Secondary | ICD-10-CM | POA: Diagnosis not present

## 2020-08-08 DIAGNOSIS — L218 Other seborrheic dermatitis: Secondary | ICD-10-CM | POA: Diagnosis not present

## 2020-08-08 DIAGNOSIS — L820 Inflamed seborrheic keratosis: Secondary | ICD-10-CM | POA: Diagnosis not present

## 2020-08-08 DIAGNOSIS — H61002 Unspecified perichondritis of left external ear: Secondary | ICD-10-CM | POA: Diagnosis not present

## 2020-08-18 DIAGNOSIS — I472 Ventricular tachycardia: Secondary | ICD-10-CM | POA: Diagnosis not present

## 2020-08-18 DIAGNOSIS — Z95810 Presence of automatic (implantable) cardiac defibrillator: Secondary | ICD-10-CM | POA: Diagnosis not present

## 2020-08-20 DIAGNOSIS — E039 Hypothyroidism, unspecified: Secondary | ICD-10-CM | POA: Diagnosis not present

## 2020-08-20 DIAGNOSIS — E119 Type 2 diabetes mellitus without complications: Secondary | ICD-10-CM | POA: Diagnosis not present

## 2020-08-20 DIAGNOSIS — I5022 Chronic systolic (congestive) heart failure: Secondary | ICD-10-CM | POA: Diagnosis not present

## 2020-08-20 DIAGNOSIS — E7800 Pure hypercholesterolemia, unspecified: Secondary | ICD-10-CM | POA: Diagnosis not present

## 2020-08-20 DIAGNOSIS — I10 Essential (primary) hypertension: Secondary | ICD-10-CM | POA: Diagnosis not present

## 2020-08-27 DIAGNOSIS — E7800 Pure hypercholesterolemia, unspecified: Secondary | ICD-10-CM | POA: Diagnosis not present

## 2020-08-27 DIAGNOSIS — E039 Hypothyroidism, unspecified: Secondary | ICD-10-CM | POA: Diagnosis not present

## 2020-08-27 DIAGNOSIS — Z6841 Body Mass Index (BMI) 40.0 and over, adult: Secondary | ICD-10-CM | POA: Diagnosis not present

## 2020-08-27 DIAGNOSIS — R454 Irritability and anger: Secondary | ICD-10-CM | POA: Diagnosis not present

## 2020-08-27 DIAGNOSIS — I10 Essential (primary) hypertension: Secondary | ICD-10-CM | POA: Diagnosis not present

## 2020-08-27 DIAGNOSIS — I5022 Chronic systolic (congestive) heart failure: Secondary | ICD-10-CM | POA: Diagnosis not present

## 2020-08-27 DIAGNOSIS — Z950 Presence of cardiac pacemaker: Secondary | ICD-10-CM | POA: Diagnosis not present

## 2020-08-27 DIAGNOSIS — R482 Apraxia: Secondary | ICD-10-CM | POA: Diagnosis not present

## 2020-08-27 DIAGNOSIS — E1159 Type 2 diabetes mellitus with other circulatory complications: Secondary | ICD-10-CM | POA: Diagnosis not present

## 2020-09-04 DIAGNOSIS — R0902 Hypoxemia: Secondary | ICD-10-CM | POA: Diagnosis not present

## 2020-09-04 DIAGNOSIS — R482 Apraxia: Secondary | ICD-10-CM | POA: Diagnosis not present

## 2020-09-04 DIAGNOSIS — I5022 Chronic systolic (congestive) heart failure: Secondary | ICD-10-CM | POA: Diagnosis not present

## 2020-10-05 DIAGNOSIS — R482 Apraxia: Secondary | ICD-10-CM | POA: Diagnosis not present

## 2020-10-05 DIAGNOSIS — R0902 Hypoxemia: Secondary | ICD-10-CM | POA: Diagnosis not present

## 2020-10-05 DIAGNOSIS — I5022 Chronic systolic (congestive) heart failure: Secondary | ICD-10-CM | POA: Diagnosis not present

## 2020-10-09 DIAGNOSIS — H61012 Acute perichondritis of left external ear: Secondary | ICD-10-CM | POA: Diagnosis not present

## 2020-10-09 DIAGNOSIS — L905 Scar conditions and fibrosis of skin: Secondary | ICD-10-CM | POA: Diagnosis not present

## 2020-10-09 DIAGNOSIS — H61002 Unspecified perichondritis of left external ear: Secondary | ICD-10-CM | POA: Diagnosis not present

## 2020-11-04 DIAGNOSIS — R0902 Hypoxemia: Secondary | ICD-10-CM | POA: Diagnosis not present

## 2020-11-04 DIAGNOSIS — I5022 Chronic systolic (congestive) heart failure: Secondary | ICD-10-CM | POA: Diagnosis not present

## 2020-11-04 DIAGNOSIS — R482 Apraxia: Secondary | ICD-10-CM | POA: Diagnosis not present

## 2020-11-10 DIAGNOSIS — H401132 Primary open-angle glaucoma, bilateral, moderate stage: Secondary | ICD-10-CM | POA: Diagnosis not present

## 2020-11-17 DIAGNOSIS — I472 Ventricular tachycardia: Secondary | ICD-10-CM | POA: Diagnosis not present

## 2020-11-17 DIAGNOSIS — Z4502 Encounter for adjustment and management of automatic implantable cardiac defibrillator: Secondary | ICD-10-CM | POA: Diagnosis not present

## 2020-11-17 DIAGNOSIS — I5089 Other heart failure: Secondary | ICD-10-CM | POA: Diagnosis not present

## 2020-11-17 DIAGNOSIS — I5022 Chronic systolic (congestive) heart failure: Secondary | ICD-10-CM | POA: Diagnosis not present

## 2020-12-04 DIAGNOSIS — R42 Dizziness and giddiness: Secondary | ICD-10-CM | POA: Diagnosis not present

## 2020-12-04 DIAGNOSIS — I110 Hypertensive heart disease with heart failure: Secondary | ICD-10-CM | POA: Diagnosis not present

## 2020-12-04 DIAGNOSIS — E119 Type 2 diabetes mellitus without complications: Secondary | ICD-10-CM | POA: Diagnosis not present

## 2020-12-04 DIAGNOSIS — R55 Syncope and collapse: Secondary | ICD-10-CM | POA: Diagnosis not present

## 2020-12-04 DIAGNOSIS — Z7984 Long term (current) use of oral hypoglycemic drugs: Secondary | ICD-10-CM | POA: Diagnosis not present

## 2020-12-04 DIAGNOSIS — I5022 Chronic systolic (congestive) heart failure: Secondary | ICD-10-CM | POA: Diagnosis not present

## 2020-12-04 DIAGNOSIS — Z79899 Other long term (current) drug therapy: Secondary | ICD-10-CM | POA: Diagnosis not present

## 2020-12-05 DIAGNOSIS — I5022 Chronic systolic (congestive) heart failure: Secondary | ICD-10-CM | POA: Diagnosis not present

## 2020-12-05 DIAGNOSIS — R482 Apraxia: Secondary | ICD-10-CM | POA: Diagnosis not present

## 2020-12-05 DIAGNOSIS — R0902 Hypoxemia: Secondary | ICD-10-CM | POA: Diagnosis not present

## 2020-12-11 DIAGNOSIS — L905 Scar conditions and fibrosis of skin: Secondary | ICD-10-CM | POA: Diagnosis not present

## 2020-12-11 DIAGNOSIS — L245 Irritant contact dermatitis due to other chemical products: Secondary | ICD-10-CM | POA: Diagnosis not present

## 2020-12-11 DIAGNOSIS — L820 Inflamed seborrheic keratosis: Secondary | ICD-10-CM | POA: Diagnosis not present

## 2021-01-04 DIAGNOSIS — R0902 Hypoxemia: Secondary | ICD-10-CM | POA: Diagnosis not present

## 2021-01-04 DIAGNOSIS — R482 Apraxia: Secondary | ICD-10-CM | POA: Diagnosis not present

## 2021-01-04 DIAGNOSIS — I5022 Chronic systolic (congestive) heart failure: Secondary | ICD-10-CM | POA: Diagnosis not present

## 2021-01-30 DIAGNOSIS — E1151 Type 2 diabetes mellitus with diabetic peripheral angiopathy without gangrene: Secondary | ICD-10-CM | POA: Diagnosis not present

## 2021-01-30 DIAGNOSIS — Z87898 Personal history of other specified conditions: Secondary | ICD-10-CM | POA: Diagnosis not present

## 2021-01-30 DIAGNOSIS — I110 Hypertensive heart disease with heart failure: Secondary | ICD-10-CM | POA: Diagnosis not present

## 2021-01-30 DIAGNOSIS — Z95810 Presence of automatic (implantable) cardiac defibrillator: Secondary | ICD-10-CM | POA: Diagnosis not present

## 2021-01-30 DIAGNOSIS — Z7982 Long term (current) use of aspirin: Secondary | ICD-10-CM | POA: Diagnosis not present

## 2021-01-30 DIAGNOSIS — Z79899 Other long term (current) drug therapy: Secondary | ICD-10-CM | POA: Diagnosis not present

## 2021-01-30 DIAGNOSIS — I5022 Chronic systolic (congestive) heart failure: Secondary | ICD-10-CM | POA: Diagnosis not present

## 2021-01-30 DIAGNOSIS — I2510 Atherosclerotic heart disease of native coronary artery without angina pectoris: Secondary | ICD-10-CM | POA: Diagnosis not present

## 2021-01-30 DIAGNOSIS — E785 Hyperlipidemia, unspecified: Secondary | ICD-10-CM | POA: Diagnosis not present

## 2021-02-04 DIAGNOSIS — R0902 Hypoxemia: Secondary | ICD-10-CM | POA: Diagnosis not present

## 2021-02-04 DIAGNOSIS — R482 Apraxia: Secondary | ICD-10-CM | POA: Diagnosis not present

## 2021-02-04 DIAGNOSIS — I5022 Chronic systolic (congestive) heart failure: Secondary | ICD-10-CM | POA: Diagnosis not present

## 2021-02-16 ENCOUNTER — Ambulatory Visit (HOSPITAL_COMMUNITY)
Admission: EM | Admit: 2021-02-16 | Discharge: 2021-02-16 | Disposition: A | Discharge status: Home / Self Care | Payer: Medicare HMO | Attending: Internal Medicine | Admitting: Internal Medicine

## 2021-02-16 ENCOUNTER — Other Ambulatory Visit: Payer: Self-pay

## 2021-02-16 ENCOUNTER — Encounter (HOSPITAL_COMMUNITY): Payer: Self-pay | Admitting: *Deleted

## 2021-02-16 DIAGNOSIS — I5022 Chronic systolic (congestive) heart failure: Secondary | ICD-10-CM | POA: Diagnosis not present

## 2021-02-16 DIAGNOSIS — Z4502 Encounter for adjustment and management of automatic implantable cardiac defibrillator: Secondary | ICD-10-CM | POA: Diagnosis not present

## 2021-02-16 DIAGNOSIS — I5089 Other heart failure: Secondary | ICD-10-CM | POA: Diagnosis not present

## 2021-02-16 DIAGNOSIS — R42 Dizziness and giddiness: Secondary | ICD-10-CM | POA: Diagnosis not present

## 2021-02-16 DIAGNOSIS — I472 Ventricular tachycardia: Secondary | ICD-10-CM | POA: Diagnosis not present

## 2021-02-16 LAB — CBG MONITORING, ED: Glucose-Capillary: 132 mg/dL — ABNORMAL HIGH (ref 70–99)

## 2021-02-16 NOTE — Discharge Instructions (Addendum)
Monitor blood pressure on a daily basis If blood pressure remains low patient may need to see primary care physician for blood pressure medications to be adjusted Return to urgent care if symptoms are persistent.

## 2021-02-16 NOTE — ED Provider Notes (Signed)
[facility]    CSN: [id number] Arrival date & time: 02/16/21  1641      History   Chief Complaint Chief Complaint  Patient presents with   Hypotension    HPI [name] is a 87 y.o. female is brought to the urgent care this morning accompanied by her daughter.  Patient had some dizziness this morning.  Dizziness started after she had her breakfast.  Patient takes her antihypertensive medications in the morning.  No confusion, numbness or extremity weakness.  Patient's blood sugar was 125.  Blood pressure was 90/40s.  Blood pressures improved over the course of 40 minutes to 1 hour.  Patient's medications have not been changed recently.  Patient denies any nausea, vomiting or diarrhea.   HPI  Past Medical History:  Diagnosis Date   CAD (coronary artery disease)    DM2 (diabetes mellitus, type 2) (HCC)    Edema    Fall    HTN (hypertension)    Pacemaker    Presence of cardiac defibrillator    Syncope     Patient Active Problem List   Diagnosis Date Noted   PULMONARY NODULE 04/22/2009   HYPERTHYROIDISM 05/09/2007   DIABETES MELLITUS, TYPE II 05/09/2007   HYPERTENSION 05/09/2007   ALLERGIC RHINITIS 05/09/2007   GERD 05/09/2007   KNEE PAIN, CHRONIC 05/09/2007   LOW BACK PAIN, CHRONIC 05/09/2007   HEPATITIS B, HX OF 05/09/2007    Past Surgical History:  Procedure Laterality Date   BACK SURGERY     EYE SURGERY      OB History   No obstetric history on file.      Home Medications    Prior to Admission medications   Medication Sig Start Date End Date Taking? Authorizing Provider  atorvastatin (LIPITOR) 20 MG tablet Take 20 mg by mouth daily.   Yes [provider]  BETIMOL 0.5 % ophthalmic solution Apply 1 drop to eye 2 (two) times daily. 09/21/19  Yes [provider]  Calcium Carb-Cholecalciferol (OYSTER SHELL CALCIUM) 500-400 MG-UNIT TABS Take by mouth.   Yes [provider]  carvedilol (COREG) 3.125 MG tablet Take by  mouth. 07/26/12  Yes [provider]  furosemide (LASIX) 40 MG tablet Take by mouth. 11/23/16  Yes [provider]  glimepiride (AMARYL) 2 MG tablet Take 2 mg by mouth daily. 10/08/19  Yes [provider]  lisinopril (ZESTRIL) 20 MG tablet Take by mouth.   Yes [provider]  omega-3 acid ethyl esters (LOVAZA) 1 g capsule Take 2 capsules by mouth 2 (two) times daily. 09/04/19  Yes [provider]  pioglitazone-glimepiride (DUETACT) 30-2 MG per tablet Take 1 tablet by mouth daily with breakfast.   Yes [provider]  timolol (TIMOPTIC) 0.5 % ophthalmic solution  02/10/18  Yes [provider]  timolol (TIMOPTIC) 0.5 % ophthalmic solution 1 drop 2 (two) times daily. 09/07/19  Yes [provider]  amLODipine (NORVASC) 2.5 MG tablet Take by mouth.    [provider]  amLODipine (NORVASC) 2.5 MG tablet Take 2.5 mg by mouth 2 (two) times daily as needed. 10/01/19   [provider]  amLODipine (NORVASC) 5 MG tablet Take 2.5 mg by mouth 3 (three) times daily.     [provider]  aspirin 81 MG tablet Take 81 mg by mouth daily.    [provider]  carbamazepine (CARBATROL) 300 MG 12 hr capsule TAKE ONE CAPSULE BY MOUTH EVERY NIGHT AT BEDTIME 12/22/11   [provider]  carvedilol (COREG) 6.25 MG tablet Take 6.25 mg by mouth 2 (two) times daily. 09/01/19   [provider]  dapagliflozin propanediol (FARXIGA) 5 MG TABS tablet Take by mouth.    [provider]  FLUoxetine (PROZAC) 20 MG capsule Take 20 mg by mouth every morning. 07/30/19   [provider]  hydrochlorothiazide (HYDRODIURIL) 25 MG tablet Take by mouth.    [provider]  hydrochlorothiazide (HYDRODIURIL) 25 MG tablet Take 25 mg by mouth every morning. 09/27/19   [provider]  levothyroxine (SYNTHROID) 75 MCG tablet Take by mouth. 07/15/14   [provider]  levothyroxine (SYNTHROID) 75  MCG tablet Take 75 mcg by mouth daily. 09/01/19   [provider]  levothyroxine (SYNTHROID) 88 MCG tablet Take 88 mcg by mouth daily. 08/31/19   [provider]  lisinopril (PRINIVIL,ZESTRIL) 20 MG tablet Take 20 mg by mouth daily.    [provider]  pioglitazone (ACTOS) 30 MG tablet Take 30 mg by mouth daily. 09/28/19   [provider]  sitaGLIPtin (JANUVIA) 100 MG tablet Take by mouth. 06/29/14   [provider]  topiramate (TOPAMAX) 25 MG tablet Take by mouth. 06/29/14   [provider]    Family History Family History  Family history unknown: Yes    Social History Social History   Tobacco Use   Smoking status: Never   Smokeless tobacco: Never  Substance Use Topics   Alcohol use: No   Drug use: No     Allergies   Coconut oil, Lisinopril, Other, Shellfish allergy, and Strawberry extract   Review of Systems Review of Systems  HENT: Negative.    Respiratory: Negative.    Cardiovascular: Negative.   Gastrointestinal: Negative.   Musculoskeletal: Negative.   Neurological:  Positive for dizziness. Negative for light-headedness and headaches.    Physical Exam Triage Vital Signs ED Triage Vitals  Enc Vitals Group     BP 02/16/21 1713 (!) 166/46     Pulse Rate 02/16/21 1713 64     Resp 02/16/21 1713 20     Temp 02/16/21 1713 98.6 F (37 C)     Temp src --      SpO2 02/16/21 1713 100 %     Weight --      Height --      Head Circumference --      Peak Flow --      Pain Score 02/16/21 1714 0     Pain Loc --      Pain Edu? --      Excl. in GC? --    No data found.  Updated Vital Signs BP (!) 166/46   Pulse 64   Temp 98.6 F (37 C)   Resp 20   SpO2 100%   Visual Acuity Right Eye Distance:   Left Eye Distance:   Bilateral Distance:    Right Eye Near:   Left Eye Near:    Bilateral Near:     Physical Exam Vitals and nursing note reviewed.  Constitutional:      General: She is not in acute  distress.    Appearance: She is not ill-appearing.  Cardiovascular:     Rate and Rhythm: Normal rate and regular rhythm.     Pulses: Normal pulses.     Heart sounds: Normal heart sounds.  Pulmonary:     Effort: Pulmonary effort is normal.     Breath sounds: Normal breath sounds.  Abdominal:  General: Bowel sounds are normal.     Palpations: Abdomen is soft.  Neurological:     Mental Status: She is alert.     UC Treatments / Results  Labs (all labs ordered are listed, but only abnormal results are displayed) Labs Reviewed  CBG MONITORING, ED - Abnormal; Notable for the following components:      Result Value   Glucose-Capillary 132 (*)    All other components within normal limits    EKG   Radiology No results found.  Procedures Procedures (including critical care time)  Medications Ordered in UC Medications - No data to display  Initial Impression / Assessment and Plan / UC Course  I have reviewed the triage vital signs and the nursing notes.  Pertinent labs & imaging results that were available during my care of the patient were reviewed by me and considered in my medical decision making (see chart for details).     1.  Dizziness, currently resolved: Continue current medication regimen No indication for labs Patient's family is advised to monitor the patient's symptoms and if she gets more hypotensive episodes, blood pressure regimen may have to be looked at. Final Clinical Impressions(s) / UC Diagnoses   Final diagnoses:  Dizziness and giddiness     Discharge Instructions      Monitor blood pressure on a daily basis If blood pressure remains low patient may need to see primary care physician for blood pressure medications to be adjusted Return to urgent care if symptoms are persistent.   ED Prescriptions   None    PDMP not reviewed this encounter.   Merrilee Jansky, MD 02/16/21 1800

## 2021-02-16 NOTE — ED Triage Notes (Signed)
Daughter reports that pt was dizzy after waking up at 11AM. Daughter reports SBP to 90. Pt was given water per family and BP checked 109/40.

## 2021-02-18 DIAGNOSIS — I10 Essential (primary) hypertension: Secondary | ICD-10-CM | POA: Diagnosis not present

## 2021-02-18 DIAGNOSIS — E039 Hypothyroidism, unspecified: Secondary | ICD-10-CM | POA: Diagnosis not present

## 2021-02-18 DIAGNOSIS — E1159 Type 2 diabetes mellitus with other circulatory complications: Secondary | ICD-10-CM | POA: Diagnosis not present

## 2021-02-18 DIAGNOSIS — E7800 Pure hypercholesterolemia, unspecified: Secondary | ICD-10-CM | POA: Diagnosis not present

## 2021-02-25 DIAGNOSIS — I5022 Chronic systolic (congestive) heart failure: Secondary | ICD-10-CM | POA: Diagnosis not present

## 2021-02-25 DIAGNOSIS — I10 Essential (primary) hypertension: Secondary | ICD-10-CM | POA: Diagnosis not present

## 2021-02-25 DIAGNOSIS — E1159 Type 2 diabetes mellitus with other circulatory complications: Secondary | ICD-10-CM | POA: Diagnosis not present

## 2021-02-25 DIAGNOSIS — E7800 Pure hypercholesterolemia, unspecified: Secondary | ICD-10-CM | POA: Diagnosis not present

## 2021-02-25 DIAGNOSIS — E039 Hypothyroidism, unspecified: Secondary | ICD-10-CM | POA: Diagnosis not present

## 2021-03-07 DIAGNOSIS — R0902 Hypoxemia: Secondary | ICD-10-CM | POA: Diagnosis not present

## 2021-03-07 DIAGNOSIS — I5022 Chronic systolic (congestive) heart failure: Secondary | ICD-10-CM | POA: Diagnosis not present

## 2021-03-07 DIAGNOSIS — R482 Apraxia: Secondary | ICD-10-CM | POA: Diagnosis not present

## 2021-03-25 DIAGNOSIS — Z9981 Dependence on supplemental oxygen: Secondary | ICD-10-CM | POA: Diagnosis not present

## 2021-03-25 DIAGNOSIS — I5022 Chronic systolic (congestive) heart failure: Secondary | ICD-10-CM | POA: Diagnosis not present

## 2021-03-25 DIAGNOSIS — Z950 Presence of cardiac pacemaker: Secondary | ICD-10-CM | POA: Diagnosis not present

## 2021-04-06 DIAGNOSIS — R482 Apraxia: Secondary | ICD-10-CM | POA: Diagnosis not present

## 2021-04-06 DIAGNOSIS — R0902 Hypoxemia: Secondary | ICD-10-CM | POA: Diagnosis not present

## 2021-04-06 DIAGNOSIS — I5022 Chronic systolic (congestive) heart failure: Secondary | ICD-10-CM | POA: Diagnosis not present

## 2021-05-04 DIAGNOSIS — E7800 Pure hypercholesterolemia, unspecified: Secondary | ICD-10-CM | POA: Diagnosis not present

## 2021-05-04 DIAGNOSIS — I10 Essential (primary) hypertension: Secondary | ICD-10-CM | POA: Diagnosis not present

## 2021-05-04 DIAGNOSIS — I5022 Chronic systolic (congestive) heart failure: Secondary | ICD-10-CM | POA: Diagnosis not present

## 2021-05-04 DIAGNOSIS — E1159 Type 2 diabetes mellitus with other circulatory complications: Secondary | ICD-10-CM | POA: Diagnosis not present

## 2021-05-12 DIAGNOSIS — E039 Hypothyroidism, unspecified: Secondary | ICD-10-CM | POA: Diagnosis not present

## 2021-05-12 DIAGNOSIS — E7800 Pure hypercholesterolemia, unspecified: Secondary | ICD-10-CM | POA: Diagnosis not present

## 2021-05-12 DIAGNOSIS — I5022 Chronic systolic (congestive) heart failure: Secondary | ICD-10-CM | POA: Diagnosis not present

## 2021-05-12 DIAGNOSIS — E1159 Type 2 diabetes mellitus with other circulatory complications: Secondary | ICD-10-CM | POA: Diagnosis not present

## 2021-05-12 DIAGNOSIS — I10 Essential (primary) hypertension: Secondary | ICD-10-CM | POA: Diagnosis not present

## 2021-05-15 DIAGNOSIS — I255 Ischemic cardiomyopathy: Secondary | ICD-10-CM | POA: Diagnosis not present

## 2021-05-15 DIAGNOSIS — E785 Hyperlipidemia, unspecified: Secondary | ICD-10-CM | POA: Diagnosis not present

## 2021-05-15 DIAGNOSIS — I2510 Atherosclerotic heart disease of native coronary artery without angina pectoris: Secondary | ICD-10-CM | POA: Diagnosis not present

## 2021-05-15 DIAGNOSIS — Z7984 Long term (current) use of oral hypoglycemic drugs: Secondary | ICD-10-CM | POA: Diagnosis not present

## 2021-05-15 DIAGNOSIS — E119 Type 2 diabetes mellitus without complications: Secondary | ICD-10-CM | POA: Diagnosis not present

## 2021-05-15 DIAGNOSIS — Z95810 Presence of automatic (implantable) cardiac defibrillator: Secondary | ICD-10-CM | POA: Diagnosis not present

## 2021-05-15 DIAGNOSIS — Z7982 Long term (current) use of aspirin: Secondary | ICD-10-CM | POA: Diagnosis not present

## 2021-05-15 DIAGNOSIS — I110 Hypertensive heart disease with heart failure: Secondary | ICD-10-CM | POA: Diagnosis not present

## 2021-05-15 DIAGNOSIS — I5022 Chronic systolic (congestive) heart failure: Secondary | ICD-10-CM | POA: Diagnosis not present

## 2021-05-18 DIAGNOSIS — I2510 Atherosclerotic heart disease of native coronary artery without angina pectoris: Secondary | ICD-10-CM | POA: Diagnosis not present

## 2021-05-18 DIAGNOSIS — Z95810 Presence of automatic (implantable) cardiac defibrillator: Secondary | ICD-10-CM | POA: Diagnosis not present

## 2021-05-18 DIAGNOSIS — E785 Hyperlipidemia, unspecified: Secondary | ICD-10-CM | POA: Diagnosis not present

## 2021-05-18 DIAGNOSIS — I110 Hypertensive heart disease with heart failure: Secondary | ICD-10-CM | POA: Diagnosis not present

## 2021-05-18 DIAGNOSIS — E1151 Type 2 diabetes mellitus with diabetic peripheral angiopathy without gangrene: Secondary | ICD-10-CM | POA: Diagnosis not present

## 2021-05-18 DIAGNOSIS — I5022 Chronic systolic (congestive) heart failure: Secondary | ICD-10-CM | POA: Diagnosis not present

## 2021-05-18 DIAGNOSIS — Z7984 Long term (current) use of oral hypoglycemic drugs: Secondary | ICD-10-CM | POA: Diagnosis not present

## 2021-05-18 DIAGNOSIS — Z7182 Exercise counseling: Secondary | ICD-10-CM | POA: Diagnosis not present

## 2021-05-18 DIAGNOSIS — Z7982 Long term (current) use of aspirin: Secondary | ICD-10-CM | POA: Diagnosis not present

## 2021-05-19 DIAGNOSIS — H401132 Primary open-angle glaucoma, bilateral, moderate stage: Secondary | ICD-10-CM | POA: Diagnosis not present

## 2021-05-26 DIAGNOSIS — E1159 Type 2 diabetes mellitus with other circulatory complications: Secondary | ICD-10-CM | POA: Diagnosis not present

## 2021-05-26 DIAGNOSIS — E039 Hypothyroidism, unspecified: Secondary | ICD-10-CM | POA: Diagnosis not present

## 2021-05-26 DIAGNOSIS — I10 Essential (primary) hypertension: Secondary | ICD-10-CM | POA: Diagnosis not present

## 2021-05-26 DIAGNOSIS — E7800 Pure hypercholesterolemia, unspecified: Secondary | ICD-10-CM | POA: Diagnosis not present

## 2021-06-02 DIAGNOSIS — E039 Hypothyroidism, unspecified: Secondary | ICD-10-CM | POA: Diagnosis not present

## 2021-06-02 DIAGNOSIS — I10 Essential (primary) hypertension: Secondary | ICD-10-CM | POA: Diagnosis not present

## 2021-06-02 DIAGNOSIS — I5022 Chronic systolic (congestive) heart failure: Secondary | ICD-10-CM | POA: Diagnosis not present

## 2021-06-02 DIAGNOSIS — Z0000 Encounter for general adult medical examination without abnormal findings: Secondary | ICD-10-CM | POA: Diagnosis not present

## 2021-06-02 DIAGNOSIS — I2510 Atherosclerotic heart disease of native coronary artery without angina pectoris: Secondary | ICD-10-CM | POA: Diagnosis not present

## 2021-06-02 DIAGNOSIS — E7800 Pure hypercholesterolemia, unspecified: Secondary | ICD-10-CM | POA: Diagnosis not present

## 2021-06-02 DIAGNOSIS — Z9981 Dependence on supplemental oxygen: Secondary | ICD-10-CM | POA: Diagnosis not present

## 2021-06-02 DIAGNOSIS — Z950 Presence of cardiac pacemaker: Secondary | ICD-10-CM | POA: Diagnosis not present

## 2021-06-02 DIAGNOSIS — E1159 Type 2 diabetes mellitus with other circulatory complications: Secondary | ICD-10-CM | POA: Diagnosis not present

## 2021-06-05 DIAGNOSIS — I3139 Other pericardial effusion (noninflammatory): Secondary | ICD-10-CM | POA: Diagnosis not present

## 2021-06-05 DIAGNOSIS — I081 Rheumatic disorders of both mitral and tricuspid valves: Secondary | ICD-10-CM | POA: Diagnosis not present

## 2021-06-05 DIAGNOSIS — I517 Cardiomegaly: Secondary | ICD-10-CM | POA: Diagnosis not present

## 2021-06-06 DIAGNOSIS — Z20822 Contact with and (suspected) exposure to covid-19: Secondary | ICD-10-CM | POA: Diagnosis not present

## 2021-08-31 ENCOUNTER — Ambulatory Visit (INDEPENDENT_AMBULATORY_CARE_PROVIDER_SITE_OTHER): Payer: Medicare Other

## 2021-08-31 ENCOUNTER — Ambulatory Visit (HOSPITAL_COMMUNITY)
Admission: EM | Admit: 2021-08-31 | Discharge: 2021-08-31 | Disposition: A | Discharge status: Home / Self Care | Payer: Medicare Other | Attending: Internal Medicine | Admitting: Internal Medicine

## 2021-08-31 ENCOUNTER — Encounter (HOSPITAL_COMMUNITY): Payer: Self-pay

## 2021-08-31 ENCOUNTER — Other Ambulatory Visit: Payer: Self-pay

## 2021-08-31 DIAGNOSIS — R0989 Other specified symptoms and signs involving the circulatory and respiratory systems: Secondary | ICD-10-CM

## 2021-08-31 DIAGNOSIS — R059 Cough, unspecified: Secondary | ICD-10-CM

## 2021-08-31 MED ORDER — BENZONATATE 100 MG PO CAPS: 100.0000 mg | ORAL_CAPSULE | Freq: Three times a day (TID) | ORAL | 0 refills | Status: DC

## 2021-08-31 MED ORDER — GUAIFENESIN ER 600 MG PO TB12: 600.0000 mg | ORAL_TABLET | Freq: Two times a day (BID) | ORAL | 0 refills | Status: AC

## 2021-08-31 NOTE — Discharge Instructions (Addendum)
Please take medications as prescribed Humidifier use and vapor rub use is recommended This will help with chest congestion Chest x-ray is negative for pneumonia Return to urgent care if symptoms worsen.

## 2021-08-31 NOTE — ED Triage Notes (Signed)
Patients daughter states her mother has had a cough and congestion (2 weeks).

## 2021-08-31 NOTE — ED Provider Notes (Signed)
[facility]    CSN: [id number] Arrival date & time: 08/31/21  1154      History   Chief Complaint Chief Complaint  Patient presents with   Cough    HPI [name] is a 88 y.o. female comes to the urgent care with a 2-week history of nonproductive cough.  Cough sounds wet but no sputum is produced.  No shortness of breath or wheezing.  No fever or chills.  No nausea, vomiting or diarrhea.  No sore throat or generalized body aches.  Cough disrupts her sleep at night.  No night sweats or weight loss.  No history of seasonal allergies.  HPI  Past Medical History:  Diagnosis Date   CAD (coronary artery disease)    DM2 (diabetes mellitus, type 2) (HCC)    Edema    Fall    HTN (hypertension)    Pacemaker    Presence of cardiac defibrillator    Syncope     Patient Active Problem List   Diagnosis Date Noted   PULMONARY NODULE 04/22/2009   HYPERTHYROIDISM 05/09/2007   DIABETES MELLITUS, TYPE II 05/09/2007   HYPERTENSION 05/09/2007   ALLERGIC RHINITIS 05/09/2007   GERD 05/09/2007   KNEE PAIN, CHRONIC 05/09/2007   LOW BACK PAIN, CHRONIC 05/09/2007   HEPATITIS B, HX OF 05/09/2007    Past Surgical History:  Procedure Laterality Date   BACK SURGERY     EYE SURGERY      OB History   No obstetric history on file.      Home Medications    Prior to Admission medications   Medication Sig Start Date End Date Taking? Authorizing Provider  benzonatate (TESSALON) 100 MG capsule Take 1 capsule (100 mg total) by mouth every 8 (eight) hours. 08/31/21  Yes Taevyn Hausen, Britta Mccreedy, MD  guaiFENesin (MUCINEX) 600 MG 12 hr tablet Take 1 tablet (600 mg total) by mouth 2 (two) times daily for 10 days. 08/31/21 09/10/21 Yes Tifani Dack, Britta Mccreedy, MD  amLODipine (NORVASC) 2.5 MG tablet Take by mouth.    [provider]  amLODipine (NORVASC) 2.5 MG tablet Take 2.5 mg by mouth 2 (two) times daily as needed. 10/01/19   [provider]  amLODipine (NORVASC) 5 MG tablet  Take 2.5 mg by mouth 3 (three) times daily.     [provider]  aspirin 81 MG tablet Take 81 mg by mouth daily.    [provider]  atorvastatin (LIPITOR) 20 MG tablet Take 20 mg by mouth daily.    [provider]  BETIMOL 0.5 % ophthalmic solution Apply 1 drop to eye 2 (two) times daily. 09/21/19   [provider]  Calcium Carb-Cholecalciferol (OYSTER SHELL CALCIUM) 500-400 MG-UNIT TABS Take by mouth.    [provider]  carbamazepine (CARBATROL) 300 MG 12 hr capsule TAKE ONE CAPSULE BY MOUTH EVERY NIGHT AT BEDTIME 12/22/11   [provider]  carvedilol (COREG) 3.125 MG tablet Take by mouth. 07/26/12   [provider]  carvedilol (COREG) 6.25 MG tablet Take 6.25 mg by mouth 2 (two) times daily. 09/01/19   [provider]  dapagliflozin propanediol (FARXIGA) 5 MG TABS tablet Take by mouth.    [provider]  FLUoxetine (PROZAC) 20 MG capsule Take 20 mg by mouth every morning. 07/30/19   [provider]  furosemide (LASIX) 40 MG tablet Take by mouth. 11/23/16   [provider]  glimepiride (AMARYL) 2 MG tablet Take 2 mg by mouth daily. 10/08/19  [provider]  hydrochlorothiazide (HYDRODIURIL) 25 MG tablet Take by mouth.    [provider]  hydrochlorothiazide (HYDRODIURIL) 25 MG tablet Take 25 mg by mouth every morning. 09/27/19   [provider]  levothyroxine (SYNTHROID) 75 MCG tablet Take by mouth. 07/15/14   [provider]  levothyroxine (SYNTHROID) 75 MCG tablet Take 75 mcg by mouth daily. 09/01/19   [provider]  levothyroxine (SYNTHROID) 88 MCG tablet Take 88 mcg by mouth daily. 08/31/19   [provider]  lisinopril (PRINIVIL,ZESTRIL) 20 MG tablet Take 20 mg by mouth daily.    [provider]  lisinopril (ZESTRIL) 20 MG tablet Take by mouth.    [provider]  omega-3 acid ethyl esters (LOVAZA) 1 g capsule Take 2 capsules  by mouth 2 (two) times daily. 09/04/19   [provider]  pioglitazone (ACTOS) 30 MG tablet Take 30 mg by mouth daily. 09/28/19   [provider]  pioglitazone-glimepiride (DUETACT) 30-2 MG per tablet Take 1 tablet by mouth daily with breakfast.    [provider]  sitaGLIPtin (JANUVIA) 100 MG tablet Take by mouth. 06/29/14   [provider]  timolol (TIMOPTIC) 0.5 % ophthalmic solution  02/10/18   [provider]  timolol (TIMOPTIC) 0.5 % ophthalmic solution 1 drop 2 (two) times daily. 09/07/19   [provider]  topiramate (TOPAMAX) 25 MG tablet Take by mouth. 06/29/14   [provider]    Family History Family History  Family history unknown: Yes    Social History Social History   Tobacco Use   Smoking status: Never   Smokeless tobacco: Never  Substance Use Topics   Alcohol use: No   Drug use: No     Allergies   Coconut oil, Lisinopril, Other, Shellfish allergy, and Strawberry extract   Review of Systems Review of Systems As per HPI  Physical Exam Triage Vital Signs ED Triage Vitals  Enc Vitals Group     BP 08/31/21 1322 (!) 116/39     Pulse Rate 08/31/21 1322 64     Resp 08/31/21 1322 20     Temp 08/31/21 1322 98.4 F (36.9 C)     Temp Source 08/31/21 1322 Oral     SpO2 08/31/21 1322 96 %     Weight --      Height --      Head Circumference --      Peak Flow --      Pain Score 08/31/21 1326 0     Pain Loc --      Pain Edu? --      Excl. in GC? --    No data found.  Updated Vital Signs BP (!) 133/47 (BP Location: Right Arm) Comment: Provider made aware   Pulse 64    Temp 98.4 F (36.9 C) (Oral)    Resp 20    SpO2 96%   Visual Acuity Right Eye Distance:   Left Eye Distance:   Bilateral Distance:    Right Eye Near:   Left Eye Near:    Bilateral Near:     Physical Exam Vitals and nursing note reviewed.  Constitutional:      General: She is not in acute distress.    Appearance: She is not  ill-appearing.  Cardiovascular:     Rate and Rhythm: Normal rate and regular rhythm.     Pulses: Normal pulses.     Heart sounds: Normal heart sounds.  Pulmonary:     Effort:  Pulmonary effort is normal. No respiratory distress.     Breath sounds: Normal breath sounds. No wheezing or rhonchi.  Abdominal:     General: Bowel sounds are normal.     Palpations: Abdomen is soft.  Neurological:     Mental Status: She is alert.     UC Treatments / Results  Labs (all labs ordered are listed, but only abnormal results are displayed) Labs Reviewed - No data to display  EKG   Radiology No results found.  Procedures Procedures (including critical care time)  Medications Ordered in UC Medications - No data to display  Initial Impression / Assessment and Plan / UC Course  I have reviewed the triage vital signs and the nursing notes.  Pertinent labs & imaging results that were available during my care of the patient were reviewed by me and considered in my medical decision making (see chart for details).     1.  Cough: Chest x-ray is negative for lung infiltrates Tessalon Perles as needed for cough Mucinex Humidifier and VapoRub use recommended Return precautions given. Final Clinical Impressions(s) / UC Diagnoses   Final diagnoses:  Cough, unspecified type     Discharge Instructions      Please take medications as prescribed Humidifier use and vapor rub use is recommended This will help with chest congestion Chest x-ray is negative for pneumonia Return to urgent care if symptoms worsen.     ED Prescriptions     Medication Sig Dispense Auth. Provider   guaiFENesin (MUCINEX) 600 MG 12 hr tablet Take 1 tablet (600 mg total) by mouth 2 (two) times daily for 10 days. 20 tablet Novi Calia, Britta Mccreedy, MD   benzonatate (TESSALON) 100 MG capsule Take 1 capsule (100 mg total) by mouth every 8 (eight) hours. 21 capsule Landers Prajapati, Britta Mccreedy, MD      PDMP not reviewed this  encounter.   Merrilee Jansky, MD 09/02/21 [phone]

## 2021-09-22 ENCOUNTER — Other Ambulatory Visit: Payer: Self-pay | Admitting: Internal Medicine

## 2021-09-22 DIAGNOSIS — Z1231 Encounter for screening mammogram for malignant neoplasm of breast: Secondary | ICD-10-CM

## 2021-10-13 ENCOUNTER — Ambulatory Visit
Admission: RE | Admit: 2021-10-13 | Discharge: 2021-10-13 | Disposition: A | Discharge status: Home / Self Care | Payer: Medicare Other | Source: Ambulatory Visit | Attending: Internal Medicine | Admitting: Internal Medicine

## 2021-10-13 DIAGNOSIS — Z1231 Encounter for screening mammogram for malignant neoplasm of breast: Secondary | ICD-10-CM

## 2021-10-15 ENCOUNTER — Other Ambulatory Visit: Payer: Self-pay | Admitting: Internal Medicine

## 2021-10-15 DIAGNOSIS — R928 Other abnormal and inconclusive findings on diagnostic imaging of breast: Secondary | ICD-10-CM

## 2021-10-29 ENCOUNTER — Other Ambulatory Visit: Payer: Self-pay | Admitting: Family Medicine

## 2021-10-29 DIAGNOSIS — R928 Other abnormal and inconclusive findings on diagnostic imaging of breast: Secondary | ICD-10-CM

## 2021-11-16 ENCOUNTER — Other Ambulatory Visit: Payer: Self-pay | Admitting: Internal Medicine

## 2021-11-16 ENCOUNTER — Ambulatory Visit
Admission: RE | Admit: 2021-11-16 | Discharge: 2021-11-16 | Disposition: A | Discharge status: Home / Self Care | Payer: Medicare Other | Source: Ambulatory Visit | Attending: Internal Medicine | Admitting: Internal Medicine

## 2021-11-16 ENCOUNTER — Ambulatory Visit (HOSPITAL_COMMUNITY): Payer: Medicare Other

## 2021-11-16 DIAGNOSIS — R928 Other abnormal and inconclusive findings on diagnostic imaging of breast: Secondary | ICD-10-CM

## 2021-11-16 DIAGNOSIS — R921 Mammographic calcification found on diagnostic imaging of breast: Secondary | ICD-10-CM

## 2021-11-25 ENCOUNTER — Other Ambulatory Visit: Payer: Self-pay | Admitting: Internal Medicine

## 2021-11-25 DIAGNOSIS — N6489 Other specified disorders of breast: Secondary | ICD-10-CM

## 2021-11-25 DIAGNOSIS — N6310 Unspecified lump in the right breast, unspecified quadrant: Secondary | ICD-10-CM

## 2021-11-25 DIAGNOSIS — R921 Mammographic calcification found on diagnostic imaging of breast: Secondary | ICD-10-CM

## 2021-11-27 ENCOUNTER — Ambulatory Visit
Admission: RE | Admit: 2021-11-27 | Discharge: 2021-11-27 | Disposition: A | Discharge status: Home / Self Care | Payer: Medicare Other | Source: Ambulatory Visit | Attending: Internal Medicine | Admitting: Internal Medicine

## 2021-11-27 DIAGNOSIS — N6310 Unspecified lump in the right breast, unspecified quadrant: Secondary | ICD-10-CM

## 2021-11-27 DIAGNOSIS — R921 Mammographic calcification found on diagnostic imaging of breast: Secondary | ICD-10-CM

## 2021-11-27 DIAGNOSIS — N6489 Other specified disorders of breast: Secondary | ICD-10-CM

## 2021-12-08 ENCOUNTER — Telehealth: Payer: Self-pay | Admitting: Hematology and Oncology

## 2021-12-08 NOTE — Telephone Encounter (Signed)
Called pt to sch appt per 6/6 staff msg. No answer. Left msg for pt to call back to sch appt.

## 2021-12-08 NOTE — Telephone Encounter (Signed)
Scheduled appt per 6/6 staff msg from [facility]. Pt is aware of appt date and time. Pt is aware to arrive 15 mins prior to appt time and to bring and updated insurance card. Pt is aware of appt location.

## 2021-12-10 NOTE — Progress Notes (Signed)
[facility] CONSULT NOTE  Patient Care Team: Irena Reichmann, DO as PCP - General (Family Medicine)  CHIEF COMPLAINTS/PURPOSE OF CONSULTATION:  Newly diagnosed right breast DCIS  HISTORY OF PRESENTING ILLNESS:  [name] 88 y.o. female is here because of recent diagnosis of right breast DCIS.  She had a routine screening mammogram that detected microcalcifications and distortion in the right breast.  Additional mammograms ultrasound revealed a 5.6 cm area of calcifications.  Stereotactic biopsy revealed low-grade DCIS additional biopsies showed fibrocystic changes and intraductal papillomas.  DCIS was ER/PR positive.  She presents to the clinic for a consult.  She is accompanied by her daughter who interpreted for [place] today.  I reviewed her records extensively and collaborated the history with the patient.  SUMMARY OF ONCOLOGIC HISTORY: Oncology History  Ductal carcinoma in situ (DCIS) of right breast  11/27/2021 Initial Diagnosis   Screening mammogram detected microcalcifications and distortion in the right breast.  Additional mammograms and ultrasound revealed 5.6 cm area of calcifications.  Stereotactic biopsy is revealed minute foci of low-grade DCIS.  Duodenal biopsies showed fibrocystic changes and intraductal papilloma.  ER 100%, PR 100%, Ki-67 1%   12/23/2021 Cancer Staging   Staging form: Breast, AJCC 8th Edition - Clinical: Stage 0 (cTis (DCIS), cN0, cM0, ER+, PR+, HER2: Not Assessed) - Signed by Serena Croissant, MD on 12/23/2021 Stage prefix: Initial diagnosis Nuclear grade: G1      MEDICAL HISTORY:  Past Medical History:  Diagnosis Date   CAD (coronary artery disease)    DM2 (diabetes mellitus, type 2) (HCC)    Edema    Fall    HTN (hypertension)    Pacemaker    Presence of cardiac defibrillator    Syncope     SURGICAL HISTORY: Past Surgical History:  Procedure Laterality Date   BACK SURGERY     EYE SURGERY      SOCIAL HISTORY: Social History    Socioeconomic History   Marital status: Widowed    Spouse name: Not on file   Number of children: Not on file   Years of education: Not on file   Highest education level: Not on file  Occupational History   Not on file  Tobacco Use   Smoking status: Never   Smokeless tobacco: Never  Substance and Sexual Activity   Alcohol use: No   Drug use: No   Sexual activity: Not Currently  Other Topics Concern   Not on file  Social History Narrative   Not on file   Social Determinants of Health   Financial Resource Strain: Not on file  Food Insecurity: Not on file  Transportation Needs: Not on file  Physical Activity: Not on file  Stress: Not on file  Social Connections: Not on file  Intimate Partner Violence: Not on file    FAMILY HISTORY: Family History  Problem Relation Age of Onset   Breast cancer Neg Hx     ALLERGIES:  is allergic to coconut (cocos nucifera), lisinopril, other, shellfish allergy, and strawberry extract.  MEDICATIONS:  Current Outpatient Medications  Medication Sig Dispense Refill   letrozole (FEMARA) 2.5 MG tablet Take 1 tablet (2.5 mg total) by mouth daily. 90 tablet 3   amLODipine (NORVASC) 2.5 MG tablet Take by mouth.     amLODipine (NORVASC) 2.5 MG tablet Take 2.5 mg by mouth 2 (two) times daily as needed.     amLODipine (NORVASC) 5 MG tablet Take 2.5 mg by mouth 3 (three) times daily.  aspirin 81 MG tablet Take 81 mg by mouth daily.     atorvastatin (LIPITOR) 20 MG tablet Take 20 mg by mouth daily.     benzonatate (TESSALON) 100 MG capsule Take 1 capsule (100 mg total) by mouth every 8 (eight) hours. 21 capsule 0   BETIMOL 0.5 % ophthalmic solution Apply 1 drop to eye 2 (two) times daily.     Calcium Carb-Cholecalciferol (OYSTER SHELL CALCIUM) 500-400 MG-UNIT TABS Take by mouth.     carbamazepine (CARBATROL) 300 MG 12 hr capsule TAKE ONE CAPSULE BY MOUTH EVERY NIGHT AT BEDTIME     carvedilol (COREG) 3.125 MG tablet Take by mouth.      carvedilol (COREG) 6.25 MG tablet Take 6.25 mg by mouth 2 (two) times daily.     dapagliflozin propanediol (FARXIGA) 5 MG TABS tablet Take by mouth.     FLUoxetine (PROZAC) 20 MG capsule Take 20 mg by mouth every morning.     furosemide (LASIX) 40 MG tablet Take by mouth.     glimepiride (AMARYL) 2 MG tablet Take 2 mg by mouth daily.     hydrochlorothiazide (HYDRODIURIL) 25 MG tablet Take by mouth.     hydrochlorothiazide (HYDRODIURIL) 25 MG tablet Take 25 mg by mouth every morning.     levothyroxine (SYNTHROID) 75 MCG tablet Take by mouth.     levothyroxine (SYNTHROID) 75 MCG tablet Take 75 mcg by mouth daily.     levothyroxine (SYNTHROID) 88 MCG tablet Take 88 mcg by mouth daily.     lisinopril (PRINIVIL,ZESTRIL) 20 MG tablet Take 20 mg by mouth daily.     lisinopril (ZESTRIL) 20 MG tablet Take by mouth.     omega-3 acid ethyl esters (LOVAZA) 1 g capsule Take 2 capsules by mouth 2 (two) times daily.     pioglitazone (ACTOS) 30 MG tablet Take 30 mg by mouth daily.     pioglitazone-glimepiride (DUETACT) 30-2 MG per tablet Take 1 tablet by mouth daily with breakfast.     sitaGLIPtin (JANUVIA) 100 MG tablet Take by mouth.     timolol (TIMOPTIC) 0.5 % ophthalmic solution      timolol (TIMOPTIC) 0.5 % ophthalmic solution 1 drop 2 (two) times daily.     topiramate (TOPAMAX) 25 MG tablet Take by mouth.     No current facility-administered medications for this visit.    REVIEW OF SYSTEMS:   Constitutional: Denies fevers, chills or abnormal night sweats  All other systems were reviewed with the patient and are negative.  PHYSICAL EXAMINATION: ECOG PERFORMANCE STATUS: 2 - Symptomatic, <50% confined to bed  Vitals:   12/23/21 1453  BP: (!) 153/52  Pulse: 72  Resp: 18  Temp: (!) 97.2 F (36.2 C)  SpO2: 95%   Filed Weights   12/23/21 1453  Weight: 198 lb 1.6 oz (89.9 kg)       LABORATORY DATA:  I have reviewed the data as listed Lab Results  Component Value Date   WBC 11.8 (H)  08/23/2019   HGB 13.1 08/23/2019   HCT 40.8 08/23/2019   MCV 89.5 08/23/2019   PLT 145 (L) 08/23/2019   Lab Results  Component Value Date   NA 141 08/23/2019   K 4.3 08/23/2019   CL 108 08/23/2019   CO2 25 08/23/2019    RADIOGRAPHIC STUDIES: I have personally reviewed the radiological reports and agreed with the findings in the report.  ASSESSMENT AND PLAN:  Ductal carcinoma in situ (DCIS) of right breast 11/27/2021: Screening mammogram detected microcalcifications  and distortion in the right breast.  Additional mammograms and ultrasound revealed 5.6 cm area of calcifications.  Stereotactic biopsy is revealed minute foci of low-grade DCIS.  Duodenal biopsies showed fibrocystic changes and intraductal papilloma.  ER 100%, PR 100%, Ki-67 1%  Pathology review: I discussed with the patient the difference between DCIS and invasive breast cancer. It is considered a precancerous lesion. DCIS is classified as a 0. It is generally detected through mammograms as calcifications. We discussed the significance of grades and its impact on prognosis. We also discussed the importance of ER and PR receptors and their implications to adjuvant treatment options. Prognosis of DCIS dependence on grade, comedo necrosis. It is anticipated that if not treated, 20-30% of DCIS can develop into invasive breast cancer.  Recommendation: Observation versus antiestrogen therapy with letrozole  Letrozole counseling: We discussed the risks and benefits of anti-estrogen therapy with aromatase inhibitors. These include but not limited to insomnia, hot flashes, mood changes, vaginal dryness, bone density loss, and weight gain. We strongly believe that the benefits far outweigh the risks. Patient understands these risks and consented to starting treatment. Planned treatment duration is 5 years.  She is interested in taking letrozole. She and her family were from [place] Return to clinic in 3 months for  follow-up.  All questions were answered. The patient knows to call the clinic with any problems, questions or concerns.    Harriette Ohara, MD 12/23/21  I Gardiner Coins am scribing for Dr. Lindi Adie  I have reviewed the above documentation for accuracy and completeness, and I agree with the above.

## 2021-12-23 ENCOUNTER — Inpatient Hospital Stay: Payer: Medicare Other | Attending: Hematology and Oncology | Admitting: Hematology and Oncology

## 2021-12-23 ENCOUNTER — Other Ambulatory Visit: Payer: Self-pay

## 2021-12-23 DIAGNOSIS — Z79899 Other long term (current) drug therapy: Secondary | ICD-10-CM | POA: Insufficient documentation

## 2021-12-23 DIAGNOSIS — D0511 Intraductal carcinoma in situ of right breast: Secondary | ICD-10-CM

## 2021-12-23 DIAGNOSIS — Z170 Estrogen receptor positive status [ER+]: Secondary | ICD-10-CM | POA: Insufficient documentation

## 2021-12-23 MED ORDER — LETROZOLE 2.5 MG PO TABS: 2.5000 mg | ORAL_TABLET | Freq: Every day | ORAL | 3 refills | Status: DC

## 2021-12-23 NOTE — Assessment & Plan Note (Signed)
11/27/2021: Screening mammogram detected microcalcifications and distortion in the right breast.  Additional mammograms and ultrasound revealed 5.6 cm area of calcifications.  Stereotactic biopsy is revealed minute foci of low-grade DCIS.  Duodenal biopsies showed fibrocystic changes and intraductal papilloma.  ER 100%, PR 100%, Ki-67 1%  Pathology review: I discussed with the patient the difference between DCIS and invasive breast cancer. It is considered a precancerous lesion. DCIS is classified as a 0. It is generally detected through mammograms as calcifications. We discussed the significance of grades and its impact on prognosis. We also discussed the importance of ER and PR receptors and their implications to adjuvant treatment options. Prognosis of DCIS dependence on grade, comedo necrosis. It is anticipated that if not treated, 20-30% of DCIS can develop into invasive breast cancer.  Recommendation: Observation versus antiestrogen therapy with tamoxifen  Tamoxifen counseling: We discussed the risks and benefits of tamoxifen. These include but not limited to insomnia, hot flashes, mood changes, vaginal dryness, and weight gain. Although rare, serious side effects including endometrial cancer, risk of blood clots were also discussed.

## 2021-12-24 ENCOUNTER — Encounter: Payer: Self-pay | Admitting: *Deleted

## 2022-02-13 IMAGING — CT CT HEAD W/O CM
3 of 4 series · 15 of 47 positions shown, 18 images · non-contrast
Comparison: Head CT 08/04/2013. Brain MRI 10/06/2008.

CLINICAL DATA: [AGE] female status post syncope and fall.

EXAM:
CT HEAD WITHOUT CONTRAST
TECHNIQUE: Contiguous axial images were obtained from the base of the skull
through the vertex without intravenous contrast.

[Series 4: head 2.0 h70h · axial · 0.44mm/px · z∈[-126,+10]mm · 9 of 84 slices shown, 12 images]
[im 8/84  brain]
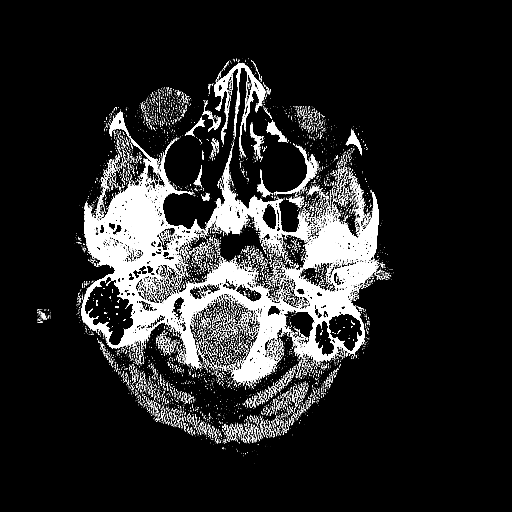
[im 8/84  bone]
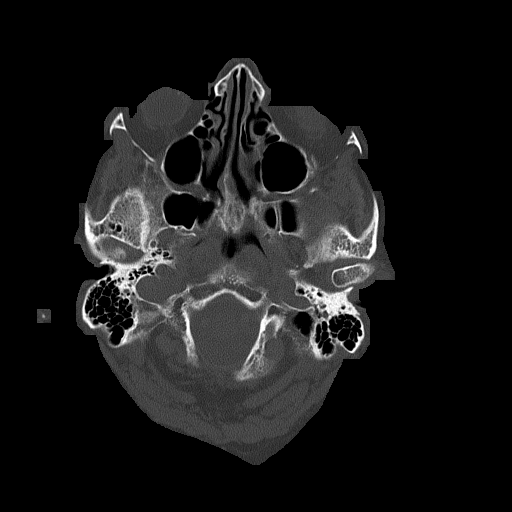
[im 16/84  brain]
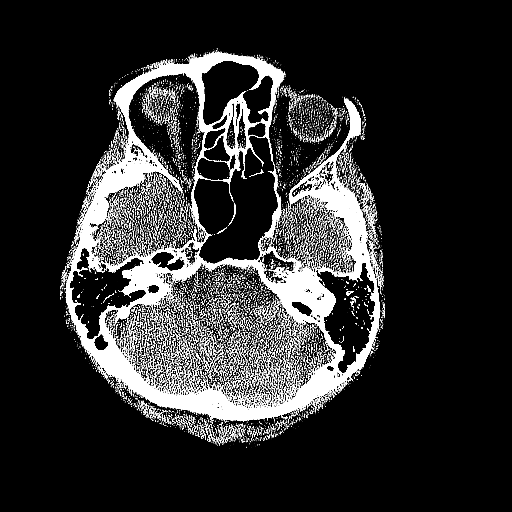
[im 24/84  brain]
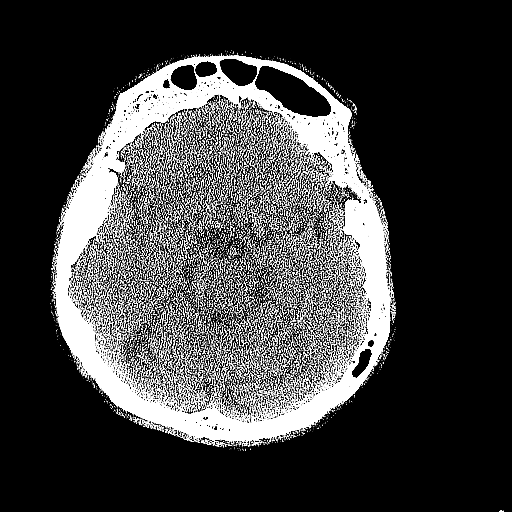
[im 32/84  brain]
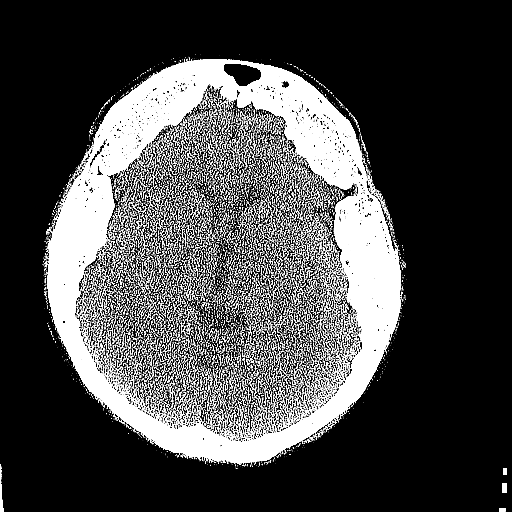
[im 44/84  brain]
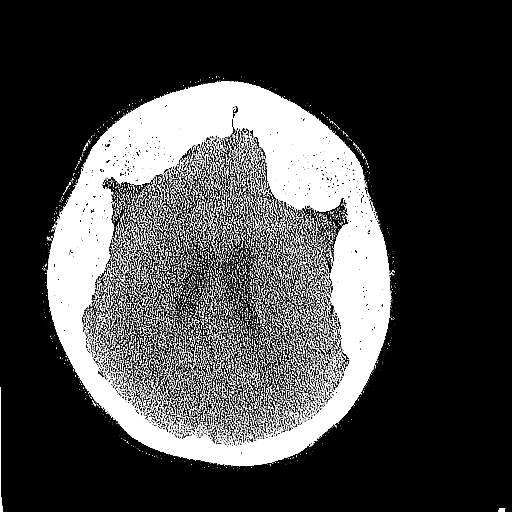
[im 44/84  bone]
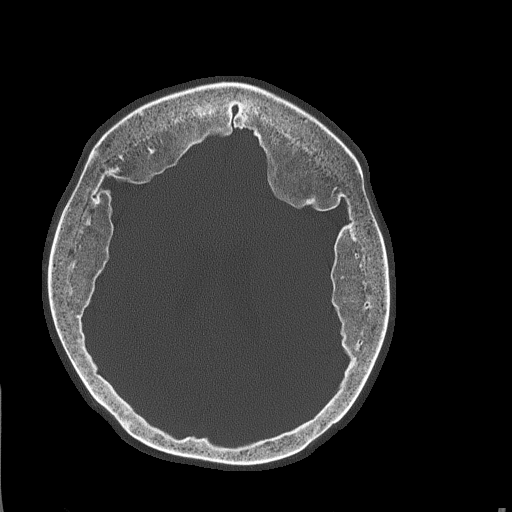
[im 52/84  brain]
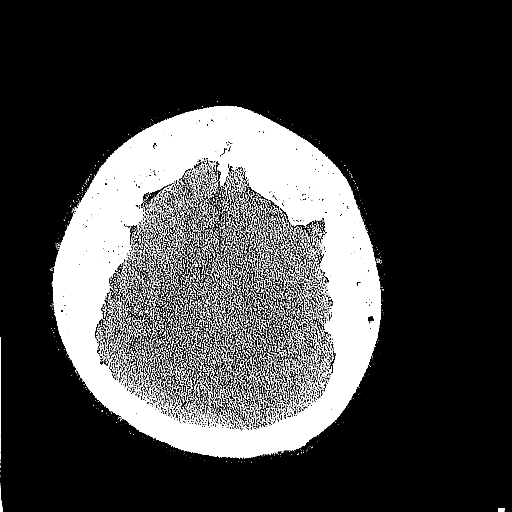
[im 60/84  brain]
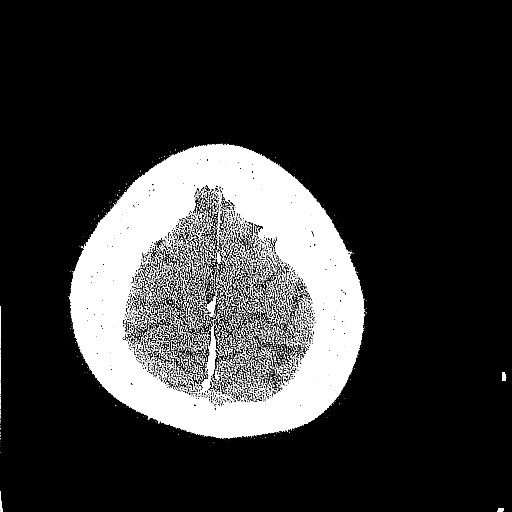
[im 68/84  brain]
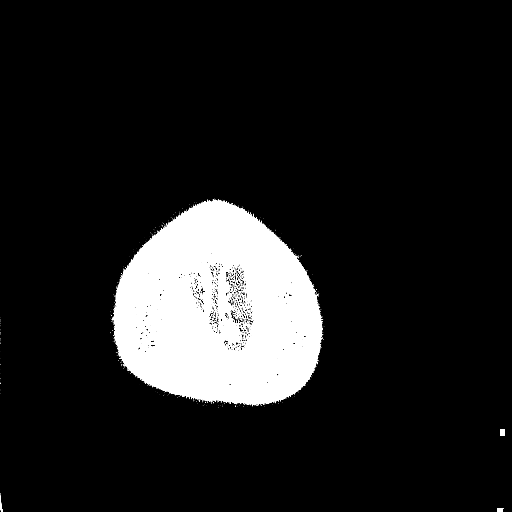
[im 76/84  brain]
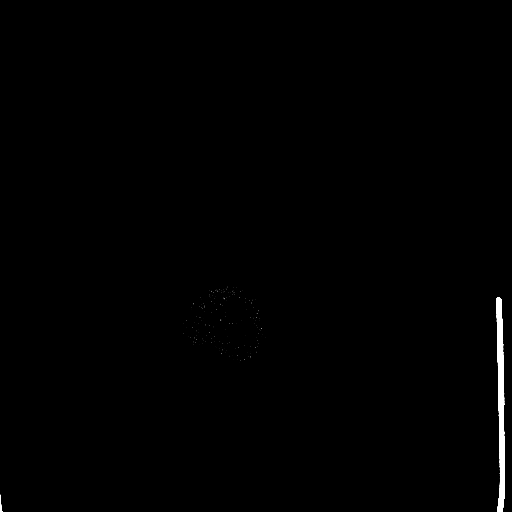
[im 76/84  bone]
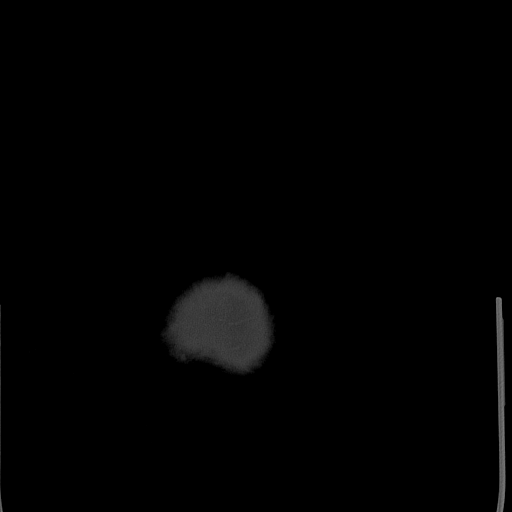

[Series 5: head 3.0 mpr cor · coronal · 0.33mm/px · 3 of 72 slices shown]
[im 24/72  brain]
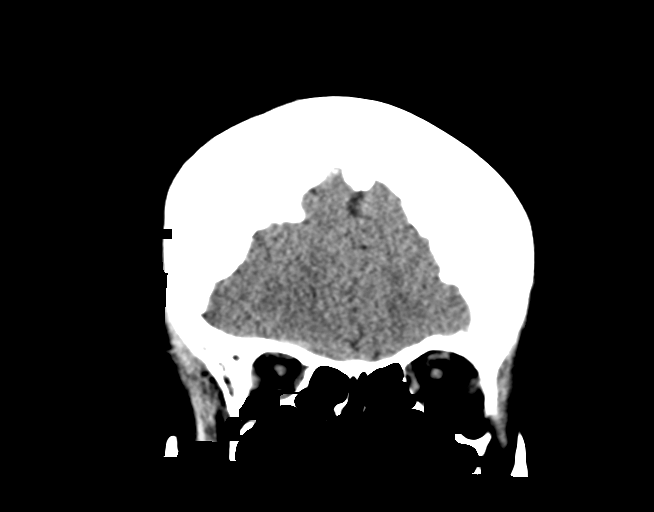
[im 32/72  brain]
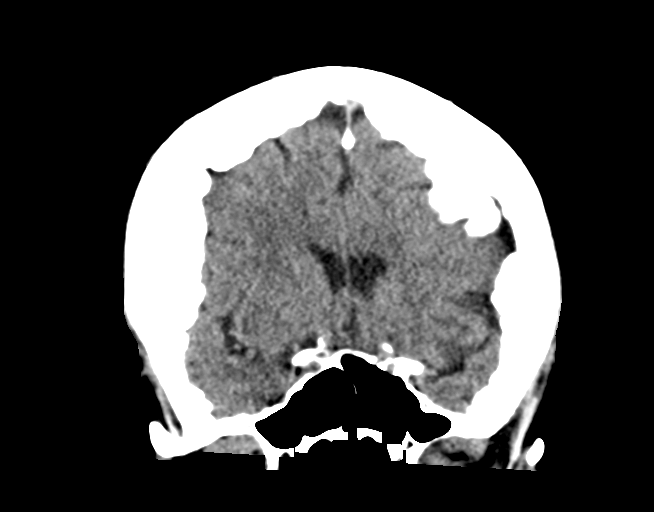
[im 40/72  brain]
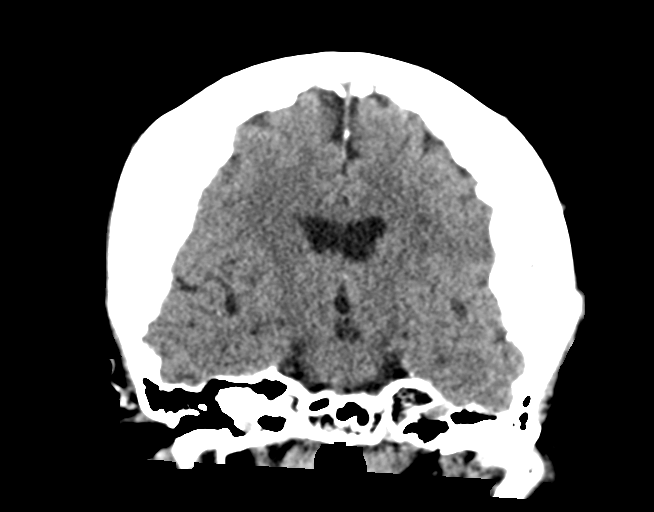

[Series 6: head 3.0 mpr sag · sagittal · 0.33mm/px · 3 of 67 slices shown]
[im 23/67  brain]
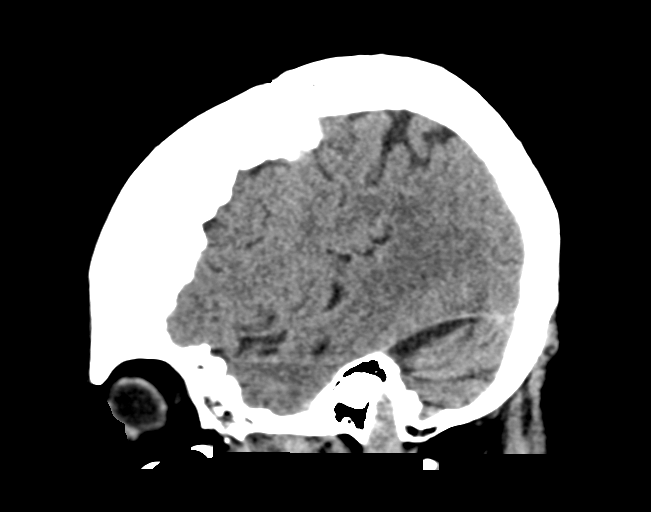
[im 34/67  brain]
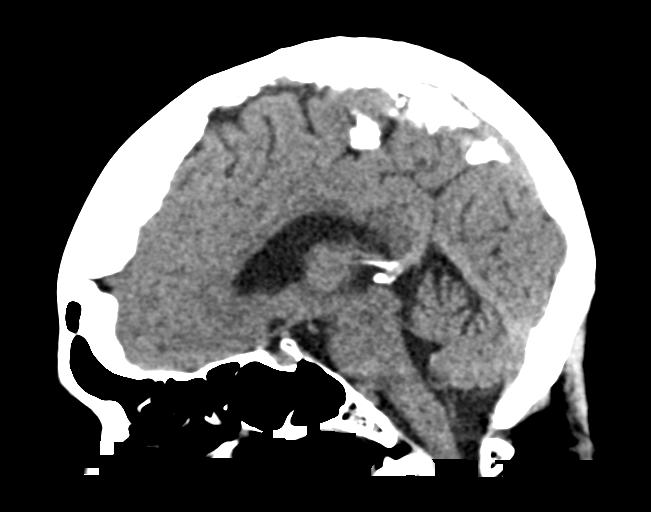
[im 45/67  brain]
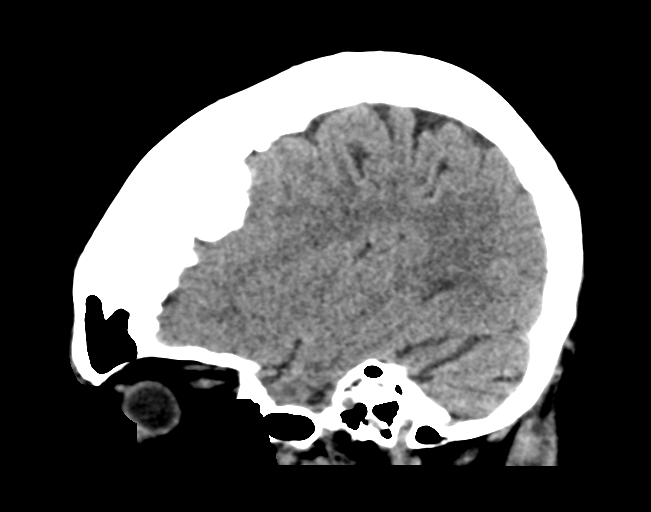

[15 of 47 positions shown; findings below may reference images not displayed]

FINDINGS: Brain: Cerebral volume is stable since 1147 and within normal limits
for age. A small chronic lacunar infarct in the right thalamus
appears to be stable on series 3, image 15. There is mild for age
scattered white matter hypodensity.

No midline shift, ventriculomegaly, mass effect, evidence of mass
lesion, intracranial hemorrhage or evidence of cortically based
acute infarction.

Vascular: Calcified atherosclerosis at the skull base. No suspicious
intracranial vascular hyperdensity.

Skull: Pronounced chronic hyperostosis, stable since 1147. No acute
osseous abnormality identified.

Sinuses/Orbits: Visualized paranasal sinuses and mastoids are stable
and well pneumatized.

Other: No acute orbit or scalp soft tissue injury identified.
IMPRESSION: 1. No acute intracranial abnormality or acute traumatic injury
identified.
2. Mild for age chronic small vessel disease.

## 2022-02-13 IMAGING — DX DG CHEST 1V PORT
1 series · 1 of 1 positions shown · non-contrast
Comparison: 02/13/2011.

CLINICAL DATA: Syncope, dizziness and weakness.

EXAM:
PORTABLE CHEST 1 VIEW

[chest ap]
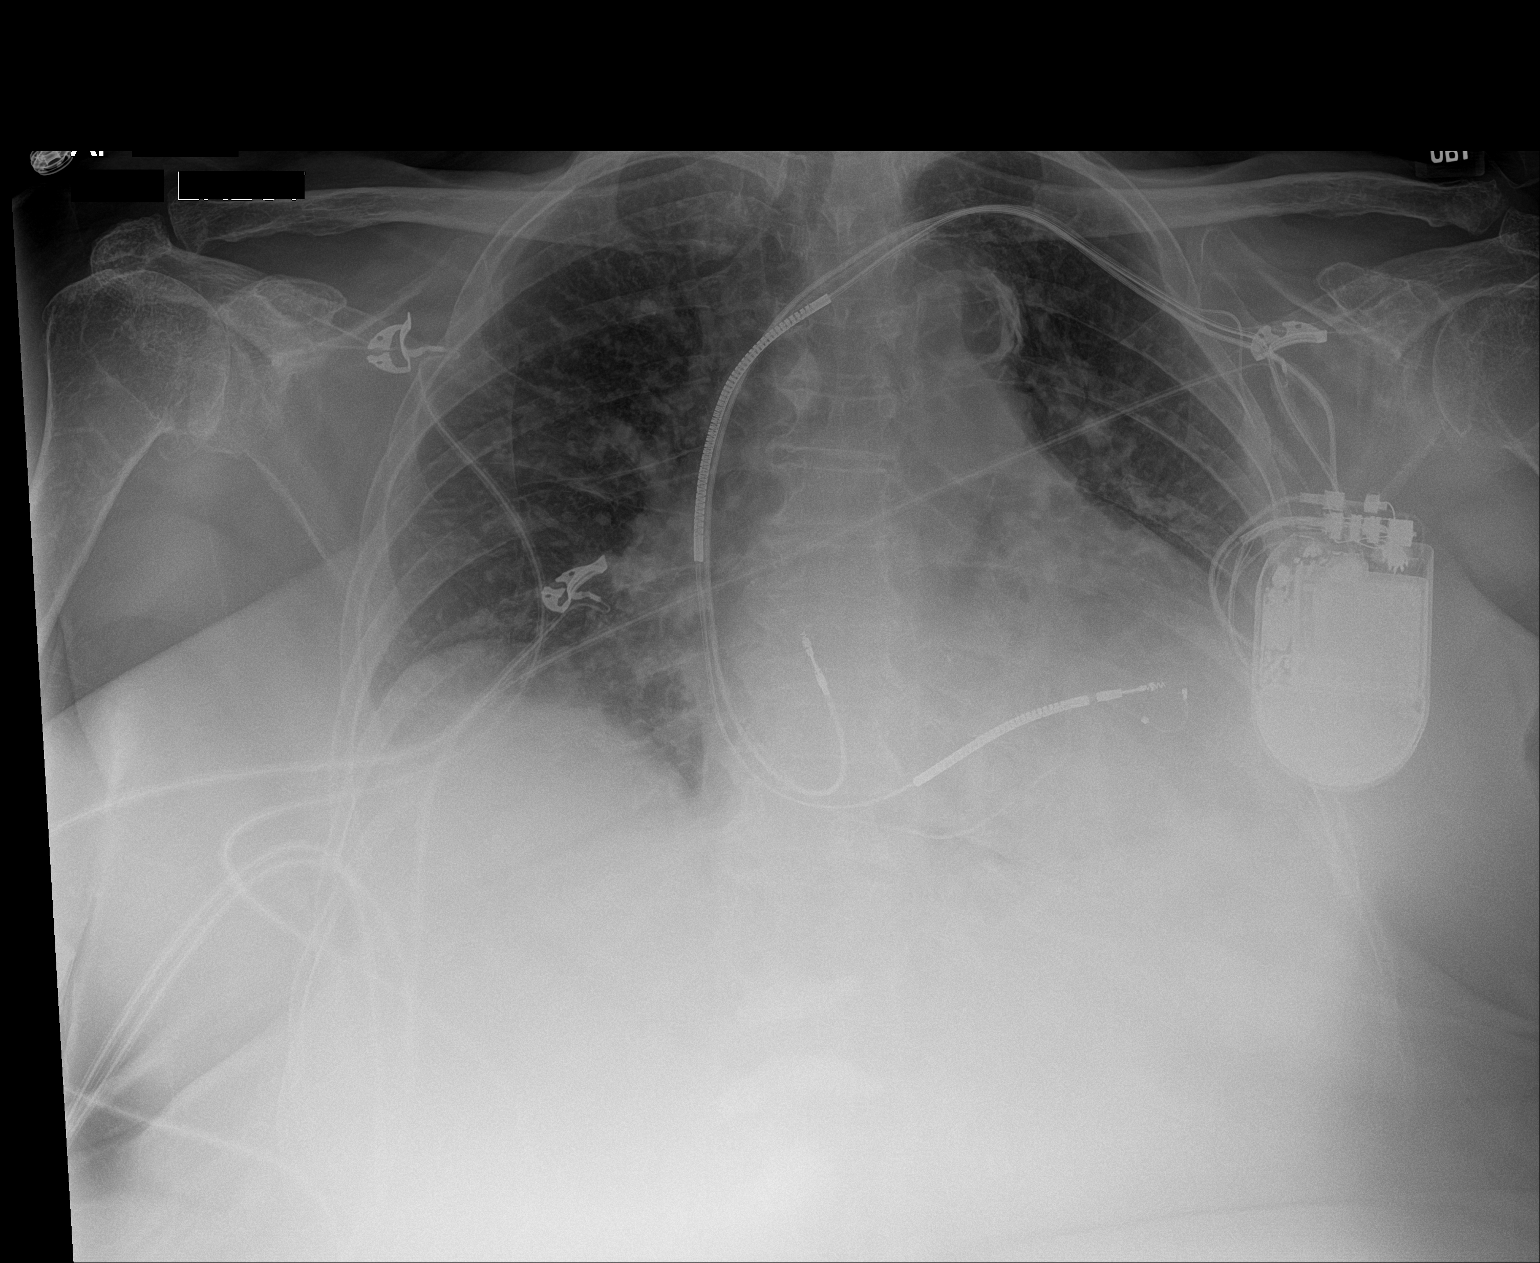

[1 of 1 positions shown; findings below may reference images not displayed]

FINDINGS: Stable enlarged cardiac silhouette and left subclavian pacer and
AICD leads. Decreased prominence of the pulmonary vasculature and
interstitial markings. Small amount of patchy and linear density at
the left lung base. Clear right lung. Diffuse osteopenia. Thoracic
spine and bilateral shoulder degenerative changes.
IMPRESSION: 1. Improved changes of congestive heart failure.
2. Mild left basilar atelectasis or scarring. Superimposed pneumonia
cannot be excluded.

## 2022-03-07 DIAGNOSIS — I5022 Chronic systolic (congestive) heart failure: Secondary | ICD-10-CM | POA: Diagnosis not present

## 2022-03-07 DIAGNOSIS — R482 Apraxia: Secondary | ICD-10-CM | POA: Diagnosis not present

## 2022-03-07 DIAGNOSIS — R0902 Hypoxemia: Secondary | ICD-10-CM | POA: Diagnosis not present

## 2022-03-16 DIAGNOSIS — H401132 Primary open-angle glaucoma, bilateral, moderate stage: Secondary | ICD-10-CM | POA: Diagnosis not present

## 2022-03-27 NOTE — Progress Notes (Signed)
Patient Care Team: Irena Reichmann, DO as PCP - General (Family Medicine) Pershing Proud, RN as Oncology Nurse Navigator Donnelly Angelica, RN as Oncology Nurse Navigator  DIAGNOSIS: No diagnosis found.  SUMMARY OF ONCOLOGIC HISTORY: Oncology History  Ductal carcinoma in situ (DCIS) of right breast  11/27/2021 Initial Diagnosis   Screening mammogram detected microcalcifications and distortion in the right breast.  Additional mammograms and ultrasound revealed 5.6 cm area of calcifications.  Stereotactic biopsy is revealed minute foci of low-grade DCIS.  Duodenal biopsies showed fibrocystic changes and intraductal papilloma.  ER 100%, PR 100%, Ki-67 1%   12/23/2021 Cancer Staging   Staging form: Breast, AJCC 8th Edition - Clinical: Stage 0 (cTis (DCIS), cN0, cM0, ER+, PR+, HER2: Not Assessed) - Signed by Serena Croissant, MD on 12/23/2021 Stage prefix: Initial diagnosis Nuclear grade: G1     CHIEF COMPLIANT: Follow-up breast cancer on letrozole  INTERVAL HISTORY: [name] is a 88 y.o. female is here because of recent diagnosis of right breast DCIS. She presents to the clinic for a follow-up.     ALLERGIES:  is allergic to coconut (cocos nucifera), lisinopril, other, shellfish allergy, and strawberry extract.  MEDICATIONS:  Current Outpatient Medications  Medication Sig Dispense Refill   amLODipine (NORVASC) 2.5 MG tablet Take by mouth.     amLODipine (NORVASC) 2.5 MG tablet Take 2.5 mg by mouth 2 (two) times daily as needed.     amLODipine (NORVASC) 5 MG tablet Take 2.5 mg by mouth 3 (three) times daily.      aspirin 81 MG tablet Take 81 mg by mouth daily.     atorvastatin (LIPITOR) 20 MG tablet Take 20 mg by mouth daily.     benzonatate (TESSALON) 100 MG capsule Take 1 capsule (100 mg total) by mouth every 8 (eight) hours. 21 capsule 0   BETIMOL 0.5 % ophthalmic solution Apply 1 drop to eye 2 (two) times daily.     Calcium Carb-Cholecalciferol (OYSTER SHELL CALCIUM) 500-400  MG-UNIT TABS Take by mouth.     carbamazepine (CARBATROL) 300 MG 12 hr capsule TAKE ONE CAPSULE BY MOUTH EVERY NIGHT AT BEDTIME     carvedilol (COREG) 3.125 MG tablet Take by mouth.     carvedilol (COREG) 6.25 MG tablet Take 6.25 mg by mouth 2 (two) times daily.     dapagliflozin propanediol (FARXIGA) 5 MG TABS tablet Take by mouth.     FLUoxetine (PROZAC) 20 MG capsule Take 20 mg by mouth every morning.     furosemide (LASIX) 40 MG tablet Take by mouth.     glimepiride (AMARYL) 2 MG tablet Take 2 mg by mouth daily.     hydrochlorothiazide (HYDRODIURIL) 25 MG tablet Take by mouth.     hydrochlorothiazide (HYDRODIURIL) 25 MG tablet Take 25 mg by mouth every morning.     letrozole (FEMARA) 2.5 MG tablet Take 1 tablet (2.5 mg total) by mouth daily. 90 tablet 3   levothyroxine (SYNTHROID) 75 MCG tablet Take by mouth.     levothyroxine (SYNTHROID) 75 MCG tablet Take 75 mcg by mouth daily.     levothyroxine (SYNTHROID) 88 MCG tablet Take 88 mcg by mouth daily.     lisinopril (PRINIVIL,ZESTRIL) 20 MG tablet Take 20 mg by mouth daily.     lisinopril (ZESTRIL) 20 MG tablet Take by mouth.     omega-3 acid ethyl esters (LOVAZA) 1 g capsule Take 2 capsules by mouth 2 (two) times daily.     pioglitazone (ACTOS) 30 MG  tablet Take 30 mg by mouth daily.     pioglitazone-glimepiride (DUETACT) 30-2 MG per tablet Take 1 tablet by mouth daily with breakfast.     sitaGLIPtin (JANUVIA) 100 MG tablet Take by mouth.     timolol (TIMOPTIC) 0.5 % ophthalmic solution      timolol (TIMOPTIC) 0.5 % ophthalmic solution 1 drop 2 (two) times daily.     topiramate (TOPAMAX) 25 MG tablet Take by mouth.     No current facility-administered medications for this visit.    PHYSICAL EXAMINATION: ECOG PERFORMANCE STATUS: {CHL ONC ECOG [id number]}  There were no vitals filed for this visit. There were no vitals filed for this visit.  BREAST:*** No palpable masses or nodules in either right or left breasts. No palpable  axillary supraclavicular or infraclavicular adenopathy no breast tenderness or nipple discharge. (exam performed in the presence of a chaperone)  LABORATORY DATA:  I have reviewed the data as listed    Latest Ref Rng & Units 08/23/2019    5:59 PM 03/15/2018    9:18 PM 08/04/2013    7:56 AM  CMP  Glucose 70 - 99 mg/dL 168  109  105   BUN 8 - 23 mg/dL 38  32  21   Creatinine 0.44 - 1.00 mg/dL 0.97  0.70  0.58   Sodium 135 - 145 mmol/L 141  141  142   Potassium 3.5 - 5.1 mmol/L 4.3  4.8  4.5   Chloride 98 - 111 mmol/L 108  106  101   CO2 22 - 32 mmol/L 25   25   Calcium 8.9 - 10.3 mg/dL 9.1   9.8     Lab Results  Component Value Date   WBC 11.8 (H) 08/23/2019   HGB 13.1 08/23/2019   HCT 40.8 08/23/2019   MCV 89.5 08/23/2019   PLT 145 (L) 08/23/2019   NEUTROABS 8.2 (H) 08/04/2013    ASSESSMENT & PLAN:  No problem-specific Assessment & Plan notes found for this encounter.    No orders of the defined types were placed in this encounter.  The patient has a good understanding of the overall plan. she agrees with it. she will call with any problems that may develop before the next visit here. Total time spent: 30 mins including face to face time and time spent for planning, charting and co-ordination of care   Suzzette Righter, [place] 03/27/22    I Gardiner Coins am scribing for Dr. Lindi Adie  ***

## 2022-03-30 ENCOUNTER — Inpatient Hospital Stay: Payer: Medicare Other | Attending: Hematology and Oncology | Admitting: Hematology and Oncology

## 2022-03-30 ENCOUNTER — Other Ambulatory Visit: Payer: Self-pay

## 2022-03-30 VITALS — BP 151/92 | HR 73 | Temp 97.2°F | Resp 18 | Ht 62.0 in | Wt 196.0 lb

## 2022-03-30 DIAGNOSIS — D0511 Intraductal carcinoma in situ of right breast: Secondary | ICD-10-CM | POA: Diagnosis not present

## 2022-03-30 DIAGNOSIS — Z79811 Long term (current) use of aromatase inhibitors: Secondary | ICD-10-CM | POA: Diagnosis not present

## 2022-03-30 NOTE — Assessment & Plan Note (Addendum)
11/27/2021: Screening mammogram detected microcalcifications and distortion in the right breast.  Additional mammograms and ultrasound revealed 5.6 cm area of calcifications.  Stereotactic biopsy is revealed minute foci of low-grade DCIS.  Duodenal biopsies showed fibrocystic changes and intraductal papilloma.  ER 100%, PR 100%, Ki-67 1%  Current treatment: Letrozole started 12/23/2021 Letrozole toxicities: Complains of joint stiffness but otherwise tolerating letrozole extremely well  Breast cancer surveillance: 1.  Breast exam 03/30/2022: Benign 2. mammograms are optional  She and her family were from [place] Her daughter plans to go to [place] in the winter. Return to clinic in 1 year for follow-up

## 2022-04-06 DIAGNOSIS — I5022 Chronic systolic (congestive) heart failure: Secondary | ICD-10-CM | POA: Diagnosis not present

## 2022-04-06 DIAGNOSIS — R482 Apraxia: Secondary | ICD-10-CM | POA: Diagnosis not present

## 2022-04-06 DIAGNOSIS — R0902 Hypoxemia: Secondary | ICD-10-CM | POA: Diagnosis not present

## 2022-05-07 DIAGNOSIS — R482 Apraxia: Secondary | ICD-10-CM | POA: Diagnosis not present

## 2022-05-07 DIAGNOSIS — R0902 Hypoxemia: Secondary | ICD-10-CM | POA: Diagnosis not present

## 2022-05-07 DIAGNOSIS — I5022 Chronic systolic (congestive) heart failure: Secondary | ICD-10-CM | POA: Diagnosis not present

## 2022-06-06 DIAGNOSIS — R0902 Hypoxemia: Secondary | ICD-10-CM | POA: Diagnosis not present

## 2022-06-06 DIAGNOSIS — R482 Apraxia: Secondary | ICD-10-CM | POA: Diagnosis not present

## 2022-06-06 DIAGNOSIS — I5022 Chronic systolic (congestive) heart failure: Secondary | ICD-10-CM | POA: Diagnosis not present

## 2022-07-07 DIAGNOSIS — I5022 Chronic systolic (congestive) heart failure: Secondary | ICD-10-CM | POA: Diagnosis not present

## 2022-07-07 DIAGNOSIS — R482 Apraxia: Secondary | ICD-10-CM | POA: Diagnosis not present

## 2022-07-07 DIAGNOSIS — R0902 Hypoxemia: Secondary | ICD-10-CM | POA: Diagnosis not present

## 2022-08-07 ENCOUNTER — Emergency Department (HOSPITAL_COMMUNITY)
Admission: EM | Admit: 2022-08-07 | Discharge: 2022-08-07 | Disposition: A | Discharge status: Home / Self Care | Payer: 59 | Attending: Emergency Medicine | Admitting: Emergency Medicine

## 2022-08-07 ENCOUNTER — Encounter (HOSPITAL_COMMUNITY): Payer: Self-pay

## 2022-08-07 DIAGNOSIS — Z7982 Long term (current) use of aspirin: Secondary | ICD-10-CM | POA: Diagnosis not present

## 2022-08-07 DIAGNOSIS — Z853 Personal history of malignant neoplasm of breast: Secondary | ICD-10-CM | POA: Insufficient documentation

## 2022-08-07 DIAGNOSIS — M5441 Lumbago with sciatica, right side: Secondary | ICD-10-CM | POA: Diagnosis not present

## 2022-08-07 DIAGNOSIS — Z79899 Other long term (current) drug therapy: Secondary | ICD-10-CM | POA: Insufficient documentation

## 2022-08-07 DIAGNOSIS — R482 Apraxia: Secondary | ICD-10-CM | POA: Diagnosis not present

## 2022-08-07 DIAGNOSIS — Z7984 Long term (current) use of oral hypoglycemic drugs: Secondary | ICD-10-CM | POA: Diagnosis not present

## 2022-08-07 DIAGNOSIS — Z95810 Presence of automatic (implantable) cardiac defibrillator: Secondary | ICD-10-CM | POA: Diagnosis not present

## 2022-08-07 DIAGNOSIS — I2510 Atherosclerotic heart disease of native coronary artery without angina pectoris: Secondary | ICD-10-CM | POA: Insufficient documentation

## 2022-08-07 DIAGNOSIS — I10 Essential (primary) hypertension: Secondary | ICD-10-CM | POA: Diagnosis not present

## 2022-08-07 DIAGNOSIS — M79604 Pain in right leg: Secondary | ICD-10-CM | POA: Diagnosis present

## 2022-08-07 DIAGNOSIS — E119 Type 2 diabetes mellitus without complications: Secondary | ICD-10-CM | POA: Insufficient documentation

## 2022-08-07 DIAGNOSIS — I5022 Chronic systolic (congestive) heart failure: Secondary | ICD-10-CM | POA: Diagnosis not present

## 2022-08-07 DIAGNOSIS — R0902 Hypoxemia: Secondary | ICD-10-CM | POA: Diagnosis not present

## 2022-08-07 MED ORDER — NAPROXEN 250 MG PO TABS
375.0000 mg | ORAL_TABLET | Freq: Once | ORAL | Status: AC
  Administered 2022-08-07: 375 mg via ORAL
  Filled 2022-08-07: qty 2

## 2022-08-07 NOTE — ED Triage Notes (Signed)
Pt c/o sharp R leg pain running from foot to groin x 1 day, R leg weakness x 2 days; denies known injury; ambulatory at home with assistance

## 2022-08-07 NOTE — ED Notes (Signed)
Pt has 2+ swelling of right legg. Pt has 1+ right pedal pulse, cap refill less than 3 sec, warm to touch, pt able to wiggle foot.

## 2022-08-07 NOTE — Discharge Instructions (Addendum)
You were evaluated today for right sided leg pain. Your symptoms are consistent with sciatica, an irritation of a nerve. Please take Tylenol, not to exceed 4000mg  daily, as directed on the bottle. Please keep your upcoming appointment with your primary care provider. Return to the emergency department as needed.

## 2022-08-07 NOTE — ED Provider Notes (Signed)
[facility] Provider Note   CSN: [id number] Arrival date & time: 08/07/22  1338     History  Chief Complaint  Patient presents with   Leg Pain    [name] is a 89 y.o. female.  Patient presents the emergency department with family complaining of right-sided leg pain which is running from her buttock region down through her groin to her lower leg.  She denies any falls or trauma.  Denies any previous injury to the hip.  She is normally ambulatory at home with some assistance from family members.  Past medical history is significant for chronic low back pain, type II DM, presence of cardiac defibrillator/pacemaker, hypertension, coronary artery disease, ductal carcinoma in situ of right breast  HPI     Home Medications Prior to Admission medications   Medication Sig Start Date End Date Taking? Authorizing Provider  amLODipine (NORVASC) 2.5 MG tablet Take by mouth.    [provider]  amLODipine (NORVASC) 2.5 MG tablet Take 2.5 mg by mouth 2 (two) times daily as needed. 10/01/19   [provider]  amLODipine (NORVASC) 5 MG tablet Take 2.5 mg by mouth 3 (three) times daily.     [provider]  aspirin 81 MG tablet Take 81 mg by mouth daily.    [provider]  atorvastatin (LIPITOR) 20 MG tablet Take 20 mg by mouth daily.    [provider]  benzonatate (TESSALON) 100 MG capsule Take 1 capsule (100 mg total) by mouth every 8 (eight) hours. 08/31/21   Lamptey, Myrene Galas, MD  BETIMOL 0.5 % ophthalmic solution Apply 1 drop to eye 2 (two) times daily. 09/21/19   [provider]  Calcium Carb-Cholecalciferol (OYSTER SHELL CALCIUM) 500-400 MG-UNIT TABS Take by mouth.    [provider]  carbamazepine (CARBATROL) 300 MG 12 hr capsule TAKE ONE CAPSULE BY MOUTH EVERY NIGHT AT BEDTIME 12/22/11   [provider]  carvedilol (COREG) 3.125 MG tablet Take by mouth. 07/26/12   [provider]  carvedilol (COREG) 6.25 MG tablet Take 6.25 mg by mouth 2 (two) times daily. 09/01/19   [provider]  dapagliflozin propanediol (FARXIGA) 5 MG TABS tablet Take by mouth.    [provider]  FLUoxetine (PROZAC) 20 MG capsule Take 20 mg by mouth every morning. 07/30/19   [provider]  furosemide (LASIX) 40 MG tablet Take by mouth. 11/23/16   [provider]  glimepiride (AMARYL) 2 MG tablet Take 2 mg by mouth daily. 10/08/19   [provider]  hydrochlorothiazide (HYDRODIURIL) 25 MG tablet Take by mouth.    [provider]  hydrochlorothiazide (HYDRODIURIL) 25 MG tablet Take 25 mg by mouth every morning. 09/27/19   [provider]  letrozole (FEMARA) 2.5 MG tablet Take 1 tablet (2.5 mg total) by mouth daily. 12/23/21   Nicholas Lose, MD  levothyroxine (SYNTHROID) 75 MCG tablet Take by mouth. 07/15/14   [provider]  levothyroxine (SYNTHROID) 75 MCG tablet Take 75 mcg by mouth daily. 09/01/19   [provider]  levothyroxine (SYNTHROID) 88 MCG tablet Take 88 mcg by mouth daily. 08/31/19   [provider]  lisinopril (PRINIVIL,ZESTRIL) 20 MG tablet Take 20 mg by mouth daily.    [provider]  lisinopril (ZESTRIL) 20 MG tablet Take by mouth.    [provider]  omega-3 acid ethyl esters (LOVAZA) 1 g capsule Take 2 capsules by mouth 2 (two) times  daily. 09/04/19   [provider]  pioglitazone (ACTOS) 30 MG tablet Take 30 mg by mouth daily. 09/28/19   [provider]  pioglitazone-glimepiride (DUETACT) 30-2 MG per tablet Take 1 tablet by mouth daily with breakfast.    [provider]  sitaGLIPtin (JANUVIA) 100 MG tablet Take by mouth. 06/29/14   [provider]  timolol (TIMOPTIC) 0.5 % ophthalmic solution  02/10/18   [provider]  timolol (TIMOPTIC) 0.5 % ophthalmic solution 1 drop 2 (two) times daily. 09/07/19   [provider]   topiramate (TOPAMAX) 25 MG tablet Take by mouth. 06/29/14   [provider]      Allergies    Coconut (cocos nucifera), Lisinopril, Other, Shellfish allergy, Shrimp extract allergy skin test, Sulfa antibiotics, and Strawberry extract    Review of Systems   Review of Systems  Constitutional:  Negative for fever.  Respiratory:  Negative for shortness of breath.   Cardiovascular:  Negative for chest pain.  Gastrointestinal:  Negative for abdominal pain.  Genitourinary:  Negative for dysuria.  Musculoskeletal:  Positive for arthralgias and back pain (Mild lumbar pain).  Neurological:  Negative for weakness.    Physical Exam Updated Vital Signs BP (!) 147/51   Pulse (!) 59   Temp 98.8 F (37.1 C) (Oral)   Resp 15   Ht 5\' 1"  (1.549 m)   Wt 88.9 kg   SpO2 94%   BMI 37.03 kg/m  Physical Exam Vitals and nursing note reviewed.  Constitutional:      General: She is not in acute distress.    Appearance: She is well-developed.  HENT:     Head: Normocephalic and atraumatic.  Eyes:     Conjunctiva/sclera: Conjunctivae normal.  Cardiovascular:     Rate and Rhythm: Normal rate and regular rhythm.     Heart sounds: No murmur heard. Pulmonary:     Effort: Pulmonary effort is normal. No respiratory distress.     Breath sounds: Normal breath sounds.  Abdominal:     Palpations: Abdomen is soft.     Tenderness: There is no abdominal tenderness.  Musculoskeletal:        General: Tenderness (Mild right sided lumbar tenderness) present. No swelling, deformity or signs of injury. Normal range of motion.     Cervical back: Neck supple.     Right lower leg: No edema.     Left lower leg: No edema.     Comments: No pain with passive range of motion of the right hip  Skin:    General: Skin is warm and dry.     Capillary Refill: Capillary refill takes less than 2 seconds.  Neurological:     General: No focal deficit present.     Mental Status: She is alert.     Motor: No  weakness.  Psychiatric:        Mood and Affect: Mood normal.     ED Results / Procedures / Treatments   Labs (all labs ordered are listed, but only abnormal results are displayed) Labs Reviewed - No data to display  EKG None  Radiology No results found.  Procedures Procedures    Medications Ordered in ED Medications  naproxen (NAPROSYN) tablet 375 mg (375 mg Oral Given 08/07/22 1537)    ED Course/ Medical Decision Making/ A&P                             Medical Decision Making  This patient presents to the ED for concern of right leg pain, this involves an extensive number of treatment options, and is a complaint that carries with it a high risk of complications and morbidity.  The differential diagnosis includes low back pain with radiculopathy, fracture, dislocation, others   Co morbidities that complicate the patient evaluation  History of chronic low back pain   Additional history obtained:  Additional history obtained from family at bedside  Lab Tests:  There is no indication for lab testing at this time   Imaging Studies ordered:  I considered both hip and lumbar imaging but the patient has no trauma to the area.  Her back pain with associated leg pain just began two days ago. She has no red flag symptoms such as saddle anesthesia, urinary incontinence, urinary frequency, fecal incontinence. Her hip moves through passive range of motion with no difficulty.     Problem List / ED Course / Critical interventions / Medication management   I ordered medication including naprosyn for inflammation  Reevaluation of the patient after these medicines showed that the patient stayed the same I have reviewed the patients home medicines and have made adjustments as needed   Social Determinants of Health:  Patient lives at home with her family   Test / Admission - Considered:  There is no indication for admission at this time.  Patient's symptoms are  consistent with lumbar radiculopathy.  She is currently asymptomatic.  She has excellent range of motion in the right hip.  Plan to discharge patient home with Tylenol.  I considered NSAIDs but they may interact with her fluoxetine and she did have a GFR of 44 on her most recent labs. I also considered a steroid but the patient has diabetes on multiple medications and I am worried that it may spike her blood glucose levels.  Patient has an appointment with primary care next week.  I recommend that she follow-up with them for further evaluation and management as needed.        Final Clinical Impression(s) / ED Diagnoses Final diagnoses:  Right-sided low back pain with right-sided sciatica, unspecified chronicity    Rx / DC Orders ED Discharge Orders     None         Ronny Bacon 08/07/22 1552    Regan Lemming, MD 08/08/22 2006

## 2022-08-10 DIAGNOSIS — I5022 Chronic systolic (congestive) heart failure: Secondary | ICD-10-CM | POA: Diagnosis not present

## 2022-08-10 DIAGNOSIS — E1159 Type 2 diabetes mellitus with other circulatory complications: Secondary | ICD-10-CM | POA: Diagnosis not present

## 2022-08-10 DIAGNOSIS — E039 Hypothyroidism, unspecified: Secondary | ICD-10-CM | POA: Diagnosis not present

## 2022-08-11 DIAGNOSIS — E7800 Pure hypercholesterolemia, unspecified: Secondary | ICD-10-CM | POA: Diagnosis not present

## 2022-08-11 DIAGNOSIS — R21 Rash and other nonspecific skin eruption: Secondary | ICD-10-CM | POA: Diagnosis not present

## 2022-08-11 DIAGNOSIS — M25551 Pain in right hip: Secondary | ICD-10-CM | POA: Diagnosis not present

## 2022-08-11 DIAGNOSIS — E039 Hypothyroidism, unspecified: Secondary | ICD-10-CM | POA: Diagnosis not present

## 2022-08-11 DIAGNOSIS — I10 Essential (primary) hypertension: Secondary | ICD-10-CM | POA: Diagnosis not present

## 2022-08-11 DIAGNOSIS — I429 Cardiomyopathy, unspecified: Secondary | ICD-10-CM | POA: Diagnosis not present

## 2022-08-11 DIAGNOSIS — E1159 Type 2 diabetes mellitus with other circulatory complications: Secondary | ICD-10-CM | POA: Diagnosis not present

## 2022-08-11 DIAGNOSIS — C50911 Malignant neoplasm of unspecified site of right female breast: Secondary | ICD-10-CM | POA: Diagnosis not present

## 2022-08-11 DIAGNOSIS — M5417 Radiculopathy, lumbosacral region: Secondary | ICD-10-CM | POA: Diagnosis not present

## 2022-08-16 DIAGNOSIS — I472 Ventricular tachycardia, unspecified: Secondary | ICD-10-CM | POA: Diagnosis not present

## 2022-08-16 DIAGNOSIS — Z4502 Encounter for adjustment and management of automatic implantable cardiac defibrillator: Secondary | ICD-10-CM | POA: Diagnosis not present

## 2022-08-19 DIAGNOSIS — M5136 Other intervertebral disc degeneration, lumbar region: Secondary | ICD-10-CM | POA: Diagnosis not present

## 2022-08-19 DIAGNOSIS — I429 Cardiomyopathy, unspecified: Secondary | ICD-10-CM | POA: Diagnosis not present

## 2022-08-19 DIAGNOSIS — Z9981 Dependence on supplemental oxygen: Secondary | ICD-10-CM | POA: Diagnosis not present

## 2022-08-19 DIAGNOSIS — R482 Apraxia: Secondary | ICD-10-CM | POA: Diagnosis not present

## 2022-08-19 DIAGNOSIS — E7800 Pure hypercholesterolemia, unspecified: Secondary | ICD-10-CM | POA: Diagnosis not present

## 2022-08-19 DIAGNOSIS — E039 Hypothyroidism, unspecified: Secondary | ICD-10-CM | POA: Diagnosis not present

## 2022-08-19 DIAGNOSIS — E1159 Type 2 diabetes mellitus with other circulatory complications: Secondary | ICD-10-CM | POA: Diagnosis not present

## 2022-08-19 DIAGNOSIS — M5417 Radiculopathy, lumbosacral region: Secondary | ICD-10-CM | POA: Diagnosis not present

## 2022-08-19 DIAGNOSIS — D0511 Intraductal carcinoma in situ of right breast: Secondary | ICD-10-CM | POA: Diagnosis not present

## 2022-08-19 DIAGNOSIS — Z0000 Encounter for general adult medical examination without abnormal findings: Secondary | ICD-10-CM | POA: Diagnosis not present

## 2022-08-19 DIAGNOSIS — M5416 Radiculopathy, lumbar region: Secondary | ICD-10-CM | POA: Diagnosis not present

## 2022-08-20 ENCOUNTER — Other Ambulatory Visit (HOSPITAL_COMMUNITY): Payer: Self-pay | Admitting: Family Medicine

## 2022-08-20 DIAGNOSIS — D0511 Intraductal carcinoma in situ of right breast: Secondary | ICD-10-CM

## 2022-08-20 DIAGNOSIS — I472 Ventricular tachycardia, unspecified: Secondary | ICD-10-CM | POA: Diagnosis not present

## 2022-08-20 DIAGNOSIS — M5416 Radiculopathy, lumbar region: Secondary | ICD-10-CM

## 2022-08-20 DIAGNOSIS — Z4502 Encounter for adjustment and management of automatic implantable cardiac defibrillator: Secondary | ICD-10-CM | POA: Diagnosis not present

## 2022-09-05 DIAGNOSIS — R482 Apraxia: Secondary | ICD-10-CM | POA: Diagnosis not present

## 2022-09-05 DIAGNOSIS — R0902 Hypoxemia: Secondary | ICD-10-CM | POA: Diagnosis not present

## 2022-09-05 DIAGNOSIS — I5022 Chronic systolic (congestive) heart failure: Secondary | ICD-10-CM | POA: Diagnosis not present

## 2022-09-14 DIAGNOSIS — H401132 Primary open-angle glaucoma, bilateral, moderate stage: Secondary | ICD-10-CM | POA: Diagnosis not present

## 2022-09-16 ENCOUNTER — Other Ambulatory Visit: Payer: Self-pay | Admitting: Family Medicine

## 2022-09-16 DIAGNOSIS — M6281 Muscle weakness (generalized): Secondary | ICD-10-CM | POA: Diagnosis not present

## 2022-09-16 DIAGNOSIS — M5431 Sciatica, right side: Secondary | ICD-10-CM | POA: Diagnosis not present

## 2022-09-16 DIAGNOSIS — R29898 Other symptoms and signs involving the musculoskeletal system: Secondary | ICD-10-CM

## 2022-09-16 DIAGNOSIS — R42 Dizziness and giddiness: Secondary | ICD-10-CM

## 2022-09-16 DIAGNOSIS — M5136 Other intervertebral disc degeneration, lumbar region: Secondary | ICD-10-CM | POA: Diagnosis not present

## 2022-09-27 ENCOUNTER — Ambulatory Visit
Admission: RE | Admit: 2022-09-27 | Discharge: 2022-09-27 | Disposition: A | Discharge status: Home / Self Care | Payer: 59 | Source: Ambulatory Visit | Attending: Family Medicine | Admitting: Family Medicine

## 2022-09-27 DIAGNOSIS — M5431 Sciatica, right side: Secondary | ICD-10-CM

## 2022-09-27 DIAGNOSIS — R42 Dizziness and giddiness: Secondary | ICD-10-CM

## 2022-09-27 DIAGNOSIS — M545 Low back pain, unspecified: Secondary | ICD-10-CM | POA: Diagnosis not present

## 2022-09-27 DIAGNOSIS — R29898 Other symptoms and signs involving the musculoskeletal system: Secondary | ICD-10-CM

## 2022-09-27 DIAGNOSIS — M5136 Other intervertebral disc degeneration, lumbar region: Secondary | ICD-10-CM

## 2022-09-27 DIAGNOSIS — M79604 Pain in right leg: Secondary | ICD-10-CM | POA: Diagnosis not present

## 2022-10-06 DIAGNOSIS — R0902 Hypoxemia: Secondary | ICD-10-CM | POA: Diagnosis not present

## 2022-10-06 DIAGNOSIS — I5022 Chronic systolic (congestive) heart failure: Secondary | ICD-10-CM | POA: Diagnosis not present

## 2022-10-06 DIAGNOSIS — R482 Apraxia: Secondary | ICD-10-CM | POA: Diagnosis not present

## 2022-10-15 ENCOUNTER — Ambulatory Visit
Admission: RE | Admit: 2022-10-15 | Discharge: 2022-10-15 | Disposition: A | Discharge status: Home / Self Care | Payer: 59 | Source: Ambulatory Visit | Attending: Hematology and Oncology | Admitting: Hematology and Oncology

## 2022-10-15 DIAGNOSIS — R921 Mammographic calcification found on diagnostic imaging of breast: Secondary | ICD-10-CM | POA: Diagnosis not present

## 2022-10-15 DIAGNOSIS — D0511 Intraductal carcinoma in situ of right breast: Secondary | ICD-10-CM

## 2022-10-28 DIAGNOSIS — M5432 Sciatica, left side: Secondary | ICD-10-CM | POA: Diagnosis not present

## 2022-10-28 DIAGNOSIS — M5431 Sciatica, right side: Secondary | ICD-10-CM | POA: Diagnosis not present

## 2022-11-05 DIAGNOSIS — R482 Apraxia: Secondary | ICD-10-CM | POA: Diagnosis not present

## 2022-11-05 DIAGNOSIS — R0902 Hypoxemia: Secondary | ICD-10-CM | POA: Diagnosis not present

## 2022-11-05 DIAGNOSIS — I5022 Chronic systolic (congestive) heart failure: Secondary | ICD-10-CM | POA: Diagnosis not present

## 2022-11-15 DIAGNOSIS — Z4502 Encounter for adjustment and management of automatic implantable cardiac defibrillator: Secondary | ICD-10-CM | POA: Diagnosis not present

## 2022-11-15 DIAGNOSIS — I4729 Other ventricular tachycardia: Secondary | ICD-10-CM | POA: Diagnosis not present

## 2022-11-15 DIAGNOSIS — I5022 Chronic systolic (congestive) heart failure: Secondary | ICD-10-CM | POA: Diagnosis not present

## 2022-11-16 DIAGNOSIS — I5022 Chronic systolic (congestive) heart failure: Secondary | ICD-10-CM | POA: Diagnosis not present

## 2022-11-16 DIAGNOSIS — Z4502 Encounter for adjustment and management of automatic implantable cardiac defibrillator: Secondary | ICD-10-CM | POA: Diagnosis not present

## 2022-11-16 DIAGNOSIS — I4729 Other ventricular tachycardia: Secondary | ICD-10-CM | POA: Diagnosis not present

## 2022-11-22 DIAGNOSIS — M6281 Muscle weakness (generalized): Secondary | ICD-10-CM | POA: Diagnosis not present

## 2022-11-25 DIAGNOSIS — M6281 Muscle weakness (generalized): Secondary | ICD-10-CM | POA: Diagnosis not present

## 2022-11-30 DIAGNOSIS — M6281 Muscle weakness (generalized): Secondary | ICD-10-CM | POA: Diagnosis not present

## 2022-12-02 DIAGNOSIS — M6281 Muscle weakness (generalized): Secondary | ICD-10-CM | POA: Diagnosis not present

## 2022-12-06 DIAGNOSIS — R0902 Hypoxemia: Secondary | ICD-10-CM | POA: Diagnosis not present

## 2022-12-06 DIAGNOSIS — I5022 Chronic systolic (congestive) heart failure: Secondary | ICD-10-CM | POA: Diagnosis not present

## 2022-12-06 DIAGNOSIS — R482 Apraxia: Secondary | ICD-10-CM | POA: Diagnosis not present

## 2022-12-08 ENCOUNTER — Encounter: Payer: Self-pay | Admitting: Emergency Medicine

## 2022-12-08 ENCOUNTER — Ambulatory Visit (INDEPENDENT_AMBULATORY_CARE_PROVIDER_SITE_OTHER): Payer: 59 | Admitting: Emergency Medicine

## 2022-12-08 VITALS — BP 128/88 | HR 65 | Temp 98.1°F | Ht 61.0 in | Wt 190.4 lb

## 2022-12-08 DIAGNOSIS — R54 Age-related physical debility: Secondary | ICD-10-CM | POA: Diagnosis not present

## 2022-12-08 DIAGNOSIS — Z7689 Persons encountering health services in other specified circumstances: Secondary | ICD-10-CM

## 2022-12-08 DIAGNOSIS — K219 Gastro-esophageal reflux disease without esophagitis: Secondary | ICD-10-CM

## 2022-12-08 DIAGNOSIS — E1165 Type 2 diabetes mellitus with hyperglycemia: Secondary | ICD-10-CM | POA: Diagnosis not present

## 2022-12-08 DIAGNOSIS — I10 Essential (primary) hypertension: Secondary | ICD-10-CM

## 2022-12-08 DIAGNOSIS — Z7984 Long term (current) use of oral hypoglycemic drugs: Secondary | ICD-10-CM | POA: Diagnosis not present

## 2022-12-08 LAB — CBC WITH DIFFERENTIAL/PLATELET
Basophils Absolute: 0 10*3/uL (ref 0.0–0.1)
Basophils Relative: 0.5 % (ref 0.0–3.0)
Eosinophils Absolute: 0.2 10*3/uL (ref 0.0–0.7)
Eosinophils Relative: 2.3 % (ref 0.0–5.0)
HCT: 40.4 % (ref 36.0–46.0)
Hemoglobin: 12.7 g/dL (ref 12.0–15.0)
Lymphocytes Relative: 15.9 % (ref 12.0–46.0)
Lymphs Abs: 1.6 10*3/uL (ref 0.7–4.0)
MCHC: 31.5 g/dL (ref 30.0–36.0)
MCV: 92.4 fl (ref 78.0–100.0)
Monocytes Absolute: 1 10*3/uL (ref 0.1–1.0)
Monocytes Relative: 9.9 % (ref 3.0–12.0)
Neutro Abs: 7 10*3/uL (ref 1.4–7.7)
Neutrophils Relative %: 71.4 % (ref 43.0–77.0)
Platelets: 153 10*3/uL (ref 150.0–400.0)
RBC: 4.38 Mil/uL (ref 3.87–5.11)
RDW: 16.5 % — ABNORMAL HIGH (ref 11.5–15.5)
WBC: 9.8 10*3/uL (ref 4.0–10.5)

## 2022-12-08 LAB — COMPREHENSIVE METABOLIC PANEL
ALT: 10 U/L (ref 0–35)
AST: 14 U/L (ref 0–37)
Albumin: 3.7 g/dL (ref 3.5–5.2)
Alkaline Phosphatase: 57 U/L (ref 39–117)
BUN: 44 mg/dL — ABNORMAL HIGH (ref 6–23)
CO2: 26 mEq/L (ref 19–32)
Calcium: 9.6 mg/dL (ref 8.4–10.5)
Chloride: 107 mEq/L (ref 96–112)
Creatinine, Ser: 0.98 mg/dL (ref 0.40–1.20)
GFR: 49.68 mL/min — ABNORMAL LOW (ref >60.00)
Glucose, Bld: 145 mg/dL — ABNORMAL HIGH (ref 70–99)
Potassium: 4.8 mEq/L (ref 3.5–5.1)
Sodium: 145 mEq/L (ref 135–145)
Total Bilirubin: 0.4 mg/dL (ref 0.2–1.2)
Total Protein: 7 g/dL (ref 6.0–8.3)

## 2022-12-08 LAB — VITAMIN B12: Vitamin B-12: >1500 pg/mL — ABNORMAL HIGH (ref 211–911)

## 2022-12-08 LAB — TSH: TSH: 1.94 u[IU]/mL (ref 0.35–5.50)

## 2022-12-08 LAB — HEMOGLOBIN A1C: Hgb A1c MFr Bld: 5.9 % (ref 4.6–6.5)

## 2022-12-08 NOTE — Progress Notes (Signed)
[name] 89 y.o.   Chief Complaint  Patient presents with   New Patient (Initial Visit)    No concerns     HISTORY OF PRESENT ILLNESS: This is a 89 y.o. female first visit to this office, here to establish care with me. Accompanied by son. Patient has history of hypertension and questionable diabetes Chronic back and leg pain History of dyslipidemia History of defibrillator permanent pacemaker inserted  HPI   Prior to Admission medications   Medication Sig Start Date End Date Taking? Authorizing Provider  aspirin 81 MG tablet Take 81 mg by mouth daily.   Yes [provider]  atorvastatin (LIPITOR) 20 MG tablet Take 20 mg by mouth daily.   Yes [provider]  benzonatate (TESSALON) 100 MG capsule Take 1 capsule (100 mg total) by mouth every 8 (eight) hours. 08/31/21  Yes Lamptey, Britta Mccreedy, MD  BETIMOL 0.5 % ophthalmic solution Apply 1 drop to eye 2 (two) times daily. 09/21/19  Yes [provider]  Calcium Carb-Cholecalciferol (OYSTER SHELL CALCIUM) 500-400 MG-UNIT TABS Take by mouth.   Yes [provider]  carbamazepine (CARBATROL) 300 MG 12 hr capsule TAKE ONE CAPSULE BY MOUTH EVERY NIGHT AT BEDTIME 12/22/11  Yes [provider]  carvedilol (COREG) 3.125 MG tablet Take by mouth. 07/26/12  Yes [provider]  FLUoxetine (PROZAC) 20 MG capsule Take 20 mg by mouth every morning. 07/30/19  Yes [provider]  glimepiride (AMARYL) 2 MG tablet Take 2 mg by mouth daily. 10/08/19  Yes [provider]  letrozole (FEMARA) 2.5 MG tablet Take 1 tablet (2.5 mg total) by mouth daily. 12/23/21  Yes Serena Croissant, MD  levothyroxine (SYNTHROID) 88 MCG tablet Take 88 mcg by mouth daily. 08/31/19  Yes [provider]  lisinopril (ZESTRIL) 20 MG tablet Take by mouth.   Yes [provider]  omega-3 acid ethyl esters (LOVAZA) 1 g capsule Take 2 capsules by mouth 2 (two) times daily. 09/04/19  Yes [provider]  timolol (TIMOPTIC) 0.5 % ophthalmic solution  02/10/18  Yes [provider]  timolol (TIMOPTIC) 0.5 % ophthalmic solution 1 drop 2 (two) times daily. 09/07/19  Yes [provider]  topiramate (TOPAMAX) 25 MG tablet Take by mouth. 06/29/14  Yes [provider]  amLODipine (NORVASC) 5 MG tablet Take 2.5 mg by mouth 3 (three) times daily.     [provider]    Allergies  Allergen Reactions   Coconut (Cocos Nucifera) Itching   Lisinopril Other (See Comments)    CAUSES RASH IN HIGHER DOSES   Other Other (See Comments)    RED RASH, ITCH   Shellfish Allergy Itching    And salmon   Shrimp Extract Itching    And salmon   Sulfa Antibiotics Itching    And salmon And salmon    Strawberry Extract Rash    Patient Active Problem List   Diagnosis Date Noted   Ductal carcinoma in situ (DCIS) of right breast 12/23/2021   PULMONARY NODULE 04/22/2009   DIABETES MELLITUS, TYPE II 05/09/2007   Essential hypertension 05/09/2007   ALLERGIC RHINITIS 05/09/2007   GERD 05/09/2007   KNEE PAIN, CHRONIC 05/09/2007   LOW BACK PAIN, CHRONIC 05/09/2007   HEPATITIS B, HX OF 05/09/2007    Past Medical History:  Diagnosis Date   CAD (coronary artery disease)    DM2 (diabetes mellitus, type 2) (HCC)    Edema    Fall    HTN (hypertension)  Pacemaker    Presence of cardiac defibrillator    Syncope     Past Surgical History:  Procedure Laterality Date   BACK SURGERY     EYE SURGERY      Social History   Socioeconomic History   Marital status: Widowed    Spouse name: Not on file   Number of children: Not on file   Years of education: Not on file   Highest education level: Not on file  Occupational History   Not on file  Tobacco Use   Smoking status: Never   Smokeless tobacco: Never  Substance and Sexual Activity   Alcohol use: No   Drug use: No   Sexual activity: Not Currently  Other Topics Concern   Not on file  Social History  Narrative   Not on file   Social Determinants of Health   Financial Resource Strain: Not on file  Food Insecurity: Not on file  Transportation Needs: Not on file  Physical Activity: Not on file  Stress: Not on file  Social Connections: Not on file  Intimate Partner Violence: Not on file    Family History  Problem Relation Age of Onset   Breast cancer Neg Hx      Review of Systems  Constitutional: Negative.  Negative for chills and fever.  HENT: Negative.  Negative for congestion and sore throat.   Respiratory: Negative.  Negative for cough and shortness of breath.   Cardiovascular: Negative.  Negative for chest pain and palpitations.  Gastrointestinal:  Negative for abdominal pain, nausea and vomiting.  Genitourinary:  Positive for frequency.  Musculoskeletal:  Positive for back pain and joint pain.  Skin: Negative.  Negative for rash.  Neurological: Negative.  Negative for dizziness and headaches.  All other systems reviewed and are negative.   Vitals:   12/08/22 1343  BP: 128/88  Pulse: 65  Temp: 98.1 F (36.7 C)  SpO2: 94%    Physical Exam Vitals reviewed.  Constitutional:      Appearance: Normal appearance. She is obese.  HENT:     Head: Normocephalic.  Eyes:     Extraocular Movements: Extraocular movements intact.     Pupils: Pupils are equal, round, and reactive to light.  Cardiovascular:     Rate and Rhythm: Normal rate and regular rhythm.     Pulses: Normal pulses.     Heart sounds: Normal heart sounds.  Pulmonary:     Effort: Pulmonary effort is normal.     Breath sounds: Normal breath sounds.  Abdominal:     Palpations: Abdomen is soft.     Tenderness: There is no abdominal tenderness.  Musculoskeletal:     Cervical back: No tenderness.  Skin:    General: Skin is warm and dry.     Capillary Refill: Capillary refill takes less than 2 seconds.  Neurological:     Mental Status: She is alert and oriented to person, place, and time.   Psychiatric:        Mood and Affect: Mood normal.        Behavior: Behavior normal.      ASSESSMENT & PLAN: A total of 48 minutes was spent with the patient and counseling/coordination of care regarding preparing for this visit, review of available medical records, establishing care with me, comprehensive history and physical examination, review of multiple chronic medical conditions and their management, review of all medications, need for geriatric medicine evaluation and assessment, prognosis, documentation and need for follow-up.  Problem List  Items Addressed This Visit       Cardiovascular and Mediastinum   Essential hypertension - Primary    BP Readings from Last 3 Encounters:  12/08/22 128/88  08/07/22 (!) 136/51  03/30/22 (!) 151/92  Continue amlodipine 5 mg daily and carvedilol 3.125 mg twice a day and lisinopril 20 mg daily        Relevant Orders   CBC with Differential/Platelet   Hemoglobin A1c   Comprehensive metabolic panel   TSH   Vitamin B12     Digestive   GERD    Well-controlled and asymptomatic        Endocrine   Diabetes (HCC)    Blood work done today including hemoglobin A1c Continue glimepiride 2 mg daily Recently not taking any other diabetic medication         Other   Frailty syndrome in geriatric patient    Old debilitated patient, very frail with limited mobility and ambulation Will benefit from geriatric evaluation and assessment of very special needs.      Relevant Orders   Ambulatory referral to Geriatrics   Other Visit Diagnoses     Encounter to establish care          Patient Instructions  Mantenimiento de la salud despus de los 65 aos de edad Health Maintenance After Age 68 Despus de los 65 aos de edad, corre un riesgo mayor de Film/video editor enfermedades e infecciones a Air cabin crew, como tambin de sufrir lesiones por cadas. Las cadas son la causa principal de las fracturas de huesos y lesiones en la cabeza de  personas mayores de 65 aos de edad. Recibir cuidados preventivos de forma regular puede ayudarlo a mantenerse saludable y en buen [place]. Los cuidados preventivos incluyen realizarse anlisis de forma regular y Forensic psychologist en el estilo de vida segn las recomendaciones del mdico. Converse con el mdico sobre lo siguiente: Las pruebas de deteccin y los anlisis que debe International aid/development worker. Una prueba de deteccin es un estudio que se para Engineer, manufacturing la presencia de una enfermedad cuando no tiene sntomas. Un plan de dieta y ejercicios adecuado para usted. Qu debo saber sobre las pruebas de deteccin y los anlisis para prevenir cadas? Realizarse pruebas de deteccin y [employer] es la mejor manera de Engineer, manufacturing un problema de salud de forma temprana. El diagnstico y tratamiento tempranos le brindan la mejor oportunidad de Chief Operating Officer las afecciones mdicas que son comunes despus de los 65 aos de edad. Ciertas afecciones y elecciones de estilo de vida pueden hacer que sea ms propenso a sufrir Engineer, site. El mdico puede recomendarle lo siguiente: Controles regulares de la visin. Una visin deficiente y afecciones como las cataratas pueden hacer que sea ms propenso a sufrir Engineer, site. Si [place] lentes, asegrese de obtener una receta actualizada si su visin cambia. Revisin de medicamentos. Revise regularmente con el mdico todos los medicamentos que toma, incluidos los medicamentos de [place]. Consulte al [employer] efectos secundarios que pueden hacer que sea ms propenso a sufrir Engineer, site. Informe al mdico si alguno de los medicamentos que toma lo hace sentir mareado o somnoliento. Controles de fuerza y equilibrio. El mdico puede recomendar ciertos estudios para controlar su fuerza y equilibrio al estar de pie, al caminar o al cambiar de posicin. Examen de los pies. El dolor y Materials engineer en los pies, como tambin no utilizar el calzado [place], pueden hacer que sea ms propenso a sufrir  Engineer, site. Pruebas de deteccin, que incluyen las siguientes:  Pruebas de deteccin para la osteoporosis. La osteoporosis es una afeccin que hace que los huesos se tornen ms dbiles y se quiebren con ms facilidad. Pruebas de deteccin para la presin arterial. Los cambios en la presin arterial y los medicamentos para Chief Operating Officer la presin arterial pueden hacerlo sentir mareado. Prueba de deteccin de la depresin. Es ms probable que sufra una cada si tiene miedo a caerse, se siente deprimido o se siente incapaz de Probation officer. Prueba de deteccin de consumo de alcohol. Beber demasiado alcohol puede afectar su equilibrio y puede hacer que sea ms propenso a sufrir Engineer, site. Siga estas indicaciones en su casa: Estilo de vida No beba alcohol si: Su mdico le indica no hacerlo. Si bebe alcohol: Limite la cantidad que bebe a lo siguiente: De 0 a 1 medida por da para las mujeres. De 0 a 2 medidas por da para los hombres. Sepa cunta cantidad de alcohol hay en las bebidas que toma. En los [address], una medida equivale a una botella de cerveza de 12 oz (355 ml), un vaso de vino de 5 oz (148 ml) o un vaso de una bebida alcohlica de alta graduacin de 1 oz (44 ml). No consuma ningn producto que contenga nicotina o tabaco. Estos productos incluyen cigarrillos, tabaco para Theatre manager y aparatos de vapeo, como los Administrator, Civil Service. Si necesita ayuda para dejar de consumir estos productos, consulte al [employer]. Actividad  Siga un programa de ejercicio regular para mantenerse en forma. Esto lo ayudar a Radio producer equilibrio. Consulte al mdico qu tipos de ejercicios son adecuados para usted. Si necesita un bastn o un andador, selo segn las recomendaciones del mdico. Utilice calzado con buen apoyo y suela antideslizante. Seguridad  Retire los [employer] puedan causar tropiezos tales como alfombras, cables u obstculos. Instale equipos de seguridad, como  barras para sostn en los baos y barandas de seguridad en las escaleras. Mantenga las habitaciones y los pasillos bien iluminados. Indicaciones generales Hable con el mdico sobre sus riesgos de sufrir una cada. Infrmele a su mdico si: Se cae. Asegrese de informarle a su mdico acerca de todas las cadas, incluso aquellas que parecen ser [employer]. Se siente mareado, cansado (tiene fatiga) o siente que pierde el equilibrio. Use los medicamentos de venta libre y los recetados solamente como se lo haya indicado el mdico. Estos incluyen suplementos. Siga una dieta sana y [place] un peso saludable. Una dieta saludable incluye productos lcteos descremados, carnes bajas en contenido de grasa ([place]), fibra de granos enteros, frijoles y [place] frutas y verduras. Mantngase al da con las vacunas. Realcese los estudios de rutina de la salud, dentales y de Wellsite geologist. Resumen Tener un estilo de vida saludable y recibir cuidados preventivos pueden ayudar a Research scientist (physical sciences) salud y el bienestar despus de los 65 aos de [place]. Realizarse pruebas de deteccin y [employer] es la mejor manera de Engineer, manufacturing un problema de salud de forma temprana y [place] a Automotive engineer una cada. El diagnstico y tratamiento tempranos le brindan la mejor oportunidad de Chief Operating Officer las afecciones mdicas ms comunes en las personas mayores de 65 aos de edad. Las cadas son la causa principal de las fracturas de huesos y lesiones en la cabeza de personas mayores de 65 aos de edad. Tome precauciones para evitar una cada en su casa. Trabaje con el mdico para saber qu cambios que puede hacer para mejorar su salud y [place], y para prevenir las cadas. Esta informacin no tiene Building services engineer  consejo del mdico. Asegrese de hacerle al mdico cualquier pregunta que tenga. Document Revised: 11/26/2020 Document Reviewed: 11/26/2020 Elsevier Patient Education  2024 Elsevier Inc.     Edwina Barth, MD [facility] at  [facility]

## 2022-12-08 NOTE — Patient Instructions (Signed)
Mantenimiento de la salud despus de los 65 aos de edad Health Maintenance After Age 87 Despus de los 65 aos de edad, corre un riesgo mayor de padecer ciertas enfermedades e infecciones a largo plazo, como tambin de sufrir lesiones por cadas. Las cadas son la causa principal de las fracturas de huesos y lesiones en la cabeza de personas mayores de 65 aos de edad. Recibir cuidados preventivos de forma regular puede ayudarlo a mantenerse saludable y en buen estado. Los cuidados preventivos incluyen realizarse anlisis de forma regular y realizar cambios en el estilo de vida segn las recomendaciones del mdico. Converse con el mdico sobre lo siguiente: Las pruebas de deteccin y los anlisis que debe realizarse. Una prueba de deteccin es un estudio que se para detectar la presencia de una enfermedad cuando no tiene sntomas. Un plan de dieta y ejercicios adecuado para usted. Qu debo saber sobre las pruebas de deteccin y los anlisis para prevenir cadas? Realizarse pruebas de deteccin y anlisis es la mejor manera de detectar un problema de salud de forma temprana. El diagnstico y tratamiento tempranos le brindan la mejor oportunidad de controlar las afecciones mdicas que son comunes despus de los 65 aos de edad. Ciertas afecciones y elecciones de estilo de vida pueden hacer que sea ms propenso a sufrir una cada. El mdico puede recomendarle lo siguiente: Controles regulares de la visin. Una visin deficiente y afecciones como las cataratas pueden hacer que sea ms propenso a sufrir una cada. Si usa lentes, asegrese de obtener una receta actualizada si su visin cambia. Revisin de medicamentos. Revise regularmente con el mdico todos los medicamentos que toma, incluidos los medicamentos de venta libre. Consulte al mdico sobre los efectos secundarios que pueden hacer que sea ms propenso a sufrir una cada. Informe al mdico si alguno de los medicamentos que toma lo hace sentir mareado o  somnoliento. Controles de fuerza y equilibrio. El mdico puede recomendar ciertos estudios para controlar su fuerza y equilibrio al estar de pie, al caminar o al cambiar de posicin. Examen de los pies. El dolor y el adormecimiento en los pies, como tambin no utilizar el calzado adecuado, pueden hacer que sea ms propenso a sufrir una cada. Pruebas de deteccin, que incluyen las siguientes: Pruebas de deteccin para la osteoporosis. La osteoporosis es una afeccin que hace que los huesos se tornen ms dbiles y se quiebren con ms facilidad. Pruebas de deteccin para la presin arterial. Los cambios en la presin arterial y los medicamentos para controlar la presin arterial pueden hacerlo sentir mareado. Prueba de deteccin de la depresin. Es ms probable que sufra una cada si tiene miedo a caerse, se siente deprimido o se siente incapaz de realizar actividades que sola hacer. Prueba de deteccin de consumo de alcohol. Beber demasiado alcohol puede afectar su equilibrio y puede hacer que sea ms propenso a sufrir una cada. Siga estas indicaciones en su casa: Estilo de vida No beba alcohol si: Su mdico le indica no hacerlo. Si bebe alcohol: Limite la cantidad que bebe a lo siguiente: De 0 a 1 medida por da para las mujeres. De 0 a 2 medidas por da para los hombres. Sepa cunta cantidad de alcohol hay en las bebidas que toma. En los [place], una medida equivale a una botella de cerveza de 12 oz (355 ml), un vaso de vino de 5 oz (148 ml) o un vaso de una bebida alcohlica de alta graduacin de 1 oz (44 ml). No consuma ningn producto que   contenga nicotina o tabaco. Estos productos incluyen cigarrillos, tabaco para mascar y aparatos de vapeo, como los cigarrillos electrnicos. Si necesita ayuda para dejar de consumir estos productos, consulte al mdico. Actividad  Siga un programa de ejercicio regular para mantenerse en forma. Esto lo ayudar a mantener el equilibrio. Consulte al  mdico qu tipos de ejercicios son adecuados para usted. Si necesita un bastn o un andador, selo segn las recomendaciones del mdico. Utilice calzado con buen apoyo y suela antideslizante. Seguridad  Retire los objetos que puedan causar tropiezos tales como alfombras, cables u obstculos. Instale equipos de seguridad, como barras para sostn en los baos y barandas de seguridad en las escaleras. Mantenga las habitaciones y los pasillos bien iluminados. Indicaciones generales Hable con el mdico sobre sus riesgos de sufrir una cada. Infrmele a su mdico si: Se cae. Asegrese de informarle a su mdico acerca de todas las cadas, incluso aquellas que parecen ser menores. Se siente mareado, cansado (tiene fatiga) o siente que pierde el equilibrio. Use los medicamentos de venta libre y los recetados solamente como se lo haya indicado el mdico. Estos incluyen suplementos. Siga una dieta sana y mantenga un peso saludable. Una dieta saludable incluye productos lcteos descremados, carnes bajas en contenido de grasa (magras), fibra de granos enteros, frijoles y muchas frutas y verduras. Mantngase al da con las vacunas. Realcese los estudios de rutina de la salud, dentales y de la vista. Resumen Tener un estilo de vida saludable y recibir cuidados preventivos pueden ayudar a promover la salud y el bienestar despus de los 65 aos de edad. Realizarse pruebas de deteccin y anlisis es la mejor manera de detectar un problema de salud de forma temprana y ayudarlo a evitar una cada. El diagnstico y tratamiento tempranos le brindan la mejor oportunidad de controlar las afecciones mdicas ms comunes en las personas mayores de 65 aos de edad. Las cadas son la causa principal de las fracturas de huesos y lesiones en la cabeza de personas mayores de 65 aos de edad. Tome precauciones para evitar una cada en su casa. Trabaje con el mdico para saber qu cambios que puede hacer para mejorar su salud y  bienestar, y para prevenir las cadas. Esta informacin no tiene como fin reemplazar el consejo del mdico. Asegrese de hacerle al mdico cualquier pregunta que tenga. Document Revised: 11/26/2020 Document Reviewed: 11/26/2020 Elsevier Patient Education  2024 Elsevier Inc.  

## 2022-12-08 NOTE — Assessment & Plan Note (Signed)
Well controlled and asymptomatic. 

## 2022-12-08 NOTE — Assessment & Plan Note (Signed)
BP Readings from Last 3 Encounters:  12/08/22 128/88  08/07/22 (!) 136/51  03/30/22 (!) 151/92  Continue amlodipine 5 mg daily and carvedilol 3.125 mg twice a day and lisinopril 20 mg daily

## 2022-12-08 NOTE — Assessment & Plan Note (Signed)
Blood work done today including hemoglobin A1c Continue glimepiride 2 mg daily Recently not taking any other diabetic medication

## 2022-12-08 NOTE — Assessment & Plan Note (Signed)
Old debilitated patient, very frail with limited mobility and ambulation Will benefit from geriatric evaluation and assessment of very special needs.

## 2022-12-09 DIAGNOSIS — M6281 Muscle weakness (generalized): Secondary | ICD-10-CM | POA: Diagnosis not present

## 2022-12-13 DIAGNOSIS — M6281 Muscle weakness (generalized): Secondary | ICD-10-CM | POA: Diagnosis not present

## 2022-12-16 DIAGNOSIS — M6281 Muscle weakness (generalized): Secondary | ICD-10-CM | POA: Diagnosis not present

## 2022-12-21 DIAGNOSIS — M6281 Muscle weakness (generalized): Secondary | ICD-10-CM | POA: Diagnosis not present

## 2022-12-23 DIAGNOSIS — M6281 Muscle weakness (generalized): Secondary | ICD-10-CM | POA: Diagnosis not present

## 2022-12-28 DIAGNOSIS — M6281 Muscle weakness (generalized): Secondary | ICD-10-CM | POA: Diagnosis not present

## 2023-01-04 DIAGNOSIS — M6281 Muscle weakness (generalized): Secondary | ICD-10-CM | POA: Diagnosis not present

## 2023-01-05 DIAGNOSIS — I5022 Chronic systolic (congestive) heart failure: Secondary | ICD-10-CM | POA: Diagnosis not present

## 2023-01-05 DIAGNOSIS — R0902 Hypoxemia: Secondary | ICD-10-CM | POA: Diagnosis not present

## 2023-01-05 DIAGNOSIS — R482 Apraxia: Secondary | ICD-10-CM | POA: Diagnosis not present

## 2023-01-28 ENCOUNTER — Ambulatory Visit: Payer: 59 | Admitting: Family

## 2023-02-05 DIAGNOSIS — R482 Apraxia: Secondary | ICD-10-CM | POA: Diagnosis not present

## 2023-02-05 DIAGNOSIS — I5022 Chronic systolic (congestive) heart failure: Secondary | ICD-10-CM | POA: Diagnosis not present

## 2023-02-05 DIAGNOSIS — R0902 Hypoxemia: Secondary | ICD-10-CM | POA: Diagnosis not present

## 2023-02-09 ENCOUNTER — Other Ambulatory Visit: Payer: Self-pay | Admitting: Hematology and Oncology

## 2023-02-10 DIAGNOSIS — E039 Hypothyroidism, unspecified: Secondary | ICD-10-CM | POA: Diagnosis not present

## 2023-02-10 DIAGNOSIS — Z79899 Other long term (current) drug therapy: Secondary | ICD-10-CM | POA: Diagnosis not present

## 2023-02-10 DIAGNOSIS — E119 Type 2 diabetes mellitus without complications: Secondary | ICD-10-CM | POA: Diagnosis not present

## 2023-02-10 DIAGNOSIS — Z13220 Encounter for screening for lipoid disorders: Secondary | ICD-10-CM | POA: Diagnosis not present

## 2023-02-11 ENCOUNTER — Encounter (HOSPITAL_COMMUNITY): Payer: Self-pay

## 2023-02-11 ENCOUNTER — Other Ambulatory Visit: Payer: Self-pay

## 2023-02-11 ENCOUNTER — Emergency Department (HOSPITAL_COMMUNITY)
Admission: EM | Admit: 2023-02-11 | Discharge: 2023-02-11 | Disposition: A | Discharge status: Home / Self Care | Payer: 59 | Attending: Emergency Medicine | Admitting: Emergency Medicine

## 2023-02-11 DIAGNOSIS — I2510 Atherosclerotic heart disease of native coronary artery without angina pectoris: Secondary | ICD-10-CM | POA: Insufficient documentation

## 2023-02-11 DIAGNOSIS — Z79899 Other long term (current) drug therapy: Secondary | ICD-10-CM | POA: Insufficient documentation

## 2023-02-11 DIAGNOSIS — B372 Candidiasis of skin and nail: Secondary | ICD-10-CM | POA: Diagnosis not present

## 2023-02-11 DIAGNOSIS — Z7984 Long term (current) use of oral hypoglycemic drugs: Secondary | ICD-10-CM | POA: Insufficient documentation

## 2023-02-11 DIAGNOSIS — I10 Essential (primary) hypertension: Secondary | ICD-10-CM | POA: Insufficient documentation

## 2023-02-11 DIAGNOSIS — E875 Hyperkalemia: Secondary | ICD-10-CM | POA: Insufficient documentation

## 2023-02-11 DIAGNOSIS — R799 Abnormal finding of blood chemistry, unspecified: Secondary | ICD-10-CM | POA: Diagnosis present

## 2023-02-11 DIAGNOSIS — E119 Type 2 diabetes mellitus without complications: Secondary | ICD-10-CM | POA: Diagnosis not present

## 2023-02-11 LAB — CBC
HCT: 41.1 % (ref 36.0–46.0)
Hemoglobin: 13 g/dL (ref 12.0–15.0)
MCH: 29.1 pg (ref 26.0–34.0)
MCHC: 31.6 g/dL (ref 30.0–36.0)
MCV: 92.2 fL (ref 80.0–100.0)
Platelets: 141 10*3/uL — ABNORMAL LOW (ref 150–400)
RBC: 4.46 MIL/uL (ref 3.87–5.11)
RDW: 15.8 % — ABNORMAL HIGH (ref 11.5–15.5)
WBC: 10.5 10*3/uL (ref 4.0–10.5)
nRBC: 0 % (ref 0.0–0.2)

## 2023-02-11 LAB — URINALYSIS, ROUTINE W REFLEX MICROSCOPIC
Bilirubin Urine: NEGATIVE
Glucose, UA: NEGATIVE mg/dL
Hgb urine dipstick: NEGATIVE
Ketones, ur: NEGATIVE mg/dL
Nitrite: NEGATIVE
Protein, ur: NEGATIVE mg/dL
Specific Gravity, Urine: 1.008 (ref 1.005–1.030)
pH: 5 (ref 5.0–8.0)

## 2023-02-11 LAB — BASIC METABOLIC PANEL
Anion gap: 7 (ref 5–15)
BUN: 44 mg/dL — ABNORMAL HIGH (ref 8–23)
CO2: 27 mmol/L (ref 22–32)
Calcium: 9.1 mg/dL (ref 8.9–10.3)
Chloride: 105 mmol/L (ref 98–111)
Creatinine, Ser: 0.96 mg/dL (ref 0.44–1.00)
GFR, Estimated: 55 mL/min — ABNORMAL LOW (ref >60)
Glucose, Bld: 165 mg/dL — ABNORMAL HIGH (ref 70–99)
Potassium: 5.3 mmol/L — ABNORMAL HIGH (ref 3.5–5.1)
Sodium: 139 mmol/L (ref 135–145)

## 2023-02-11 MED ORDER — NYSTATIN 100000 UNIT/GM EX CREA: TOPICAL_CREAM | CUTANEOUS | 0 refills | Status: AC

## 2023-02-11 MED ORDER — SODIUM ZIRCONIUM CYCLOSILICATE 10 G PO PACK
10.0000 g | PACK | Freq: Once | ORAL | Status: AC
Start: 2023-02-11 — End: 2023-02-11
  Administered 2023-02-11: 10 g via ORAL
  Filled 2023-02-11: qty 1

## 2023-02-11 NOTE — ED Provider Notes (Signed)
**[name] De-Identified via Obfuscation** Veronica [name]  Veronica [name] Arrival date & time: 02/11/23 1352  Chief Complaint(s) Abnormal Labs (Hyperkalemia)  HPI Veronica [name] is [name] 89 y.o. female with past medical history as below, significant for CAD, DM2, PPM who presents to the ED with complaint of abnormal potassium  She had outpatient labs drawn, shown potassium to be elevated. She has also increased urine frequency w/o dysuria or abd pain, no fevers, no cp or dib. Also concerned for rash under breasts b/l for last week. Thinks it might be yeast   Past Medical History Past Medical History:  Diagnosis Date   CAD (coronary artery disease)    DM2 (diabetes mellitus, type 2) (HCC)    Edema    Fall    HTN (hypertension)    Pacemaker    Presence of cardiac defibrillator    Syncope    Patient Active Problem List   Diagnosis Date Noted   Frailty syndrome in geriatric patient 12/08/2022   Ductal carcinoma in situ (DCIS) of right breast 12/23/2021   PULMONARY NODULE 04/22/2009   Diabetes (HCC) 05/09/2007   Essential hypertension 05/09/2007   ALLERGIC RHINITIS 05/09/2007   GERD 05/09/2007   KNEE PAIN, CHRONIC 05/09/2007   LOW BACK PAIN, CHRONIC 05/09/2007   HEPATITIS B, HX OF 05/09/2007   Home Medication(s) Prior to Admission medications   Medication Sig Start Date End Date Taking? Authorizing Provider  amLODipine (NORVASC) 5 MG tablet Take 2.5 mg by mouth daily.   Yes Provider, Historical, Veronica [name]  atorvastatin (LIPITOR) 20 MG tablet Take 20 mg by mouth daily.   Yes Provider, Historical, Veronica [name]  carvedilol (COREG) 3.125 MG tablet Take 3.125 mg by mouth 2 (two) times daily with [name] meal. 07/26/12  Yes Provider, Historical, Veronica [name]  FLUoxetine (PROZAC) 20 MG capsule Take 20 mg by mouth every morning. 07/30/19  Yes Provider, Historical, Veronica [name]  furosemide (LASIX) 40 MG tablet Take 40 mg by mouth daily. 02/02/23  Yes Provider, Historical, Veronica [name]  glimepiride (AMARYL) 2 MG tablet Take 2 mg by  mouth daily. 10/08/19  Yes Provider, Historical, Veronica [name]  letrozole (FEMARA) 2.5 MG tablet TAKE 1 TABLET(2.5 MG) BY MOUTH DAILY Patient taking differently: Take 2.5 mg by mouth daily. 02/09/23  Yes Veronica [name]  levothyroxine (SYNTHROID) 88 MCG tablet Take 88 mcg by mouth daily. 08/31/19  Yes Provider, Historical, Veronica [name]  lisinopril (ZESTRIL) 20 MG tablet Take 20 mg by mouth daily.   Yes Provider, Historical, Veronica [name]  nystatin cream (MYCOSTATIN) Apply to affected area 2 times daily 02/11/23  Yes Veronica [name], Veronica [name]  pioglitazone (ACTOS) 30 MG tablet Take 30 mg by mouth daily. 02/09/23  Yes Provider, Historical, Veronica [name]  timolol (TIMOPTIC) 0.5 % ophthalmic solution Place 1 drop into both eyes 2 (two) times daily. 09/07/19  Yes Provider, Historical, Veronica [name]  benzonatate (TESSALON) 100 MG capsule Take 1 capsule (100 mg total) by mouth every 8 (eight) hours. Patient not taking: Reported on 02/11/2023 08/31/21   Veronica [name], Veronica [name]  Past Surgical History Past Surgical History:  Procedure Laterality Date   BACK SURGERY     EYE SURGERY     Family History Family History  Problem Relation Age of Onset   Breast cancer Neg Hx     Social History Social History   Tobacco Use   Smoking status: Never   Smokeless tobacco: Never  Substance Use Topics   Alcohol use: No   Drug use: No   Allergies Coconut (cocos nucifera), Lisinopril, Other, Shellfish allergy, Shrimp extract, Sulfa antibiotics, and Strawberry extract  Review of Systems Review of Systems  Constitutional:  Negative for chills and fever.  HENT:  Negative for congestion and postnasal drip.   Respiratory:  Negative for chest tightness and shortness of breath.   Cardiovascular:  Negative for chest pain and palpitations.  Gastrointestinal:  Negative for abdominal pain and nausea.  Genitourinary:  Positive for frequency. Negative for  difficulty urinating.  Musculoskeletal:  Negative for arthralgias and myalgias.  All other systems reviewed and are negative.   Physical Exam Vital Signs  I have reviewed the triage vital signs BP (!) 143/55   Pulse 65   Temp 97.7 F (36.5 C) (Oral)   Resp 16   Ht 5\' 1"  (1.549 m)   Wt 88.5 kg   SpO2 99%   BMI 36.84 kg/m  Physical Exam Vitals and nursing [name] reviewed.  Constitutional:      General: She is not in acute distress.    Appearance: Normal appearance. She is well-developed. She is not ill-appearing.  HENT:     Head: Normocephalic and atraumatic.     Right Ear: External ear normal.     Left Ear: External ear normal.     Nose: Nose normal.     Mouth/Throat:     Mouth: Mucous membranes are moist.  Eyes:     General: No scleral icterus.       Right eye: No discharge.        Left eye: No discharge.  Cardiovascular:     Rate and Rhythm: Normal rate and regular rhythm.     Pulses: Normal pulses.     Heart sounds: Normal heart sounds.  Pulmonary:     Effort: Pulmonary effort is normal. No respiratory distress.     Breath sounds: No stridor.  Abdominal:     General: Abdomen is flat. There is no distension.     Palpations: Abdomen is soft.     Tenderness: There is no guarding.  Musculoskeletal:        General: No deformity.     Cervical back: No rigidity.  Skin:    General: Skin is warm and dry.     Coloration: Skin is not cyanotic, jaundiced or pale.     Comments: yeast noted under breast b/l  Neurological:     Mental Status: She is alert and oriented to person, place, and time.     GCS: GCS eye subscore is 4. GCS verbal subscore is 5. GCS motor subscore is 6.  Psychiatric:        Speech: Speech normal.        Behavior: Behavior normal. Behavior is cooperative.     ED Results and Treatments Labs (all labs ordered are listed, but only abnormal results are displayed) Labs Reviewed  CBC - Abnormal; Notable for the following components:      Result Value    RDW 15.8 (*)    Platelets 141 (*)    All other components within  normal limits  BASIC METABOLIC PANEL - Abnormal; Notable for the following components:   Potassium 5.3 (*)    Glucose, Bld 165 (*)    BUN 44 (*)    GFR, Estimated 55 (*)    All other components within normal limits  URINALYSIS, ROUTINE W REFLEX MICROSCOPIC - Abnormal; Notable for the following components:   Color, Urine STRAW (*)    Leukocytes,Ua LARGE (*)    Bacteria, UA FEW (*)    All other components within normal limits                                                                                                                          Radiology No results found.  Pertinent labs & imaging results that were available during my care of the patient were reviewed by me and considered in my medical decision making (see MDM for details).  Medications Ordered in ED Medications  sodium zirconium cyclosilicate (LOKELMA) packet 10 g (has no administration in time range)                                                                                                                                     Procedures Procedures  (including critical care time)  Medical Decision Making / ED Course    Medical Decision Making:    TENIESHA STAUCH is [name] 89 y.o. female  with past medical history as below, significant for CAD, DM2, PPM who presents to the ED with complaint of abnormal potassium . The complaint involves an extensive differential diagnosis and also carries with it [name] high risk of complications and morbidity.  Serious etiology was considered.   Complete initial physical exam performed, notably the patient  was Nad, feels normal.    Reviewed and confirmed nursing documentation for past medical history, family history, social history.  Vital signs reviewed.       Narrative: 89 yo female Here with K+ Offered interpreter but prefers to have family translate for her Sent by PCP for elev potassium She has some  +frequency but no other uti symptoms Labs reviewed here are stable, K is mildly elevated but improved; cr stable Give lokelma CBC stable UA stable Pt with mildly elevated K+ Lokelma here, advised f/u with pcp for recheck labs in next few days Rehydration encouraged Likely candidal skin infection under breasts, start nystatin, appears mild, see pcp for recheck  in next few days   The patient improved significantly and was discharged in stable condition. Detailed discussions were had with the patient regarding current findings, and need for close f/u with PCP or on call doctor. The patient has been instructed to return immediately if the symptoms worsen in any way for re-evaluation. Patient verbalized understanding and is in agreement with current care plan. All questions answered prior to discharge.                   Additional history obtained: -Additional history obtained from family -External records from outside source obtained and reviewed including: Chart review including previous notes, labs, imaging, consultation notes including prior labs Home meds   Lab Tests: -I ordered, reviewed, and interpreted labs.   The pertinent results include:   Labs Reviewed  CBC - Abnormal; Notable for the following components:      Result Value   RDW 15.8 (*)    Platelets 141 (*)    All other components within normal limits  BASIC METABOLIC PANEL - Abnormal; Notable for the following components:   Potassium 5.3 (*)    Glucose, Bld 165 (*)    BUN 44 (*)    GFR, Estimated 55 (*)    All other components within normal limits  URINALYSIS, ROUTINE W REFLEX MICROSCOPIC - Abnormal; Notable for the following components:   Color, Urine STRAW (*)    Leukocytes,Ua LARGE (*)    Bacteria, UA FEW (*)    All other components within normal limits    Notable for k mildly elev 5.3  EKG   EKG Interpretation Date/Time:    Ventricular Rate:    PR Interval:    QRS Duration:    QT  Interval:    QTC Calculation:   R Axis:      Text Interpretation:           Imaging Studies ordered: na   Medicines ordered and prescription drug management: Meds ordered this encounter  Medications   sodium zirconium cyclosilicate (LOKELMA) packet 10 g   nystatin cream (MYCOSTATIN)    Sig: Apply to affected area 2 times daily    Dispense:  30 g    Refill:  0    -I have reviewed the patients home medicines and have made adjustments as needed   Consultations Obtained: na   Cardiac Monitoring: The patient was maintained on [name] cardiac monitor.  I personally viewed and interpreted the cardiac monitored which showed an underlying rhythm of: NSR  Social Determinants of Health:  Diagnosis or treatment significantly limited by social determinants of health: obesity   Reevaluation: After the interventions noted above, I reevaluated the patient and found that they have stayed the same  Co morbidities that complicate the patient evaluation  Past Medical History:  Diagnosis Date   CAD (coronary artery disease)    DM2 (diabetes mellitus, type 2) (HCC)    Edema    Fall    HTN (hypertension)    Pacemaker    Presence of cardiac defibrillator    Syncope       Dispostion: Disposition decision including need for hospitalization was considered, and patient discharged from emergency department.    Final Clinical Impression(s) / ED Diagnoses Final diagnoses:  Hyperkalemia  Candidal skin infection        Veronica Leiter, Veronica [name] 02/11/23 2038

## 2023-02-11 NOTE — Discharge Instructions (Addendum)
You labs today were stable, no evidence of infection in urine  You were given a medication to lower your potassium  Recommend staying hydrated next few days and seeing your PCP in the next 3-5 days for repeat lab work  It was a pleasure caring for you today in the emergency department.  Please return to the emergency department for any worsening or worrisome symptoms.  -----  Sus anlisis de hoy fueron estables, no hay evidencia de infeccin en la orina  Le dieron un medicamento para reducir el potasio  Recomendamos que se mantenga hidratado los prximos das y que visite a su mdico de Programmer, applications los prximos 3 a 5 das para repetir los [employer] de laboratorio  Fue un placer atenderlo hoy en el departamento de Sports administrator.  Por favor, regrese al departamento de emergencias si presenta sntomas que empeoran o son preocupantes.  -----

## 2023-02-11 NOTE — ED Triage Notes (Signed)
Pt presents with a report of elevated potassium of 6.2. Pt had routine labs drawn by her PCP and they noticed the abnormality. Pt denies any symptoms.

## 2023-02-11 NOTE — ED Notes (Signed)
Spoke with Dr. Wallace Cullens.  [place] Lokelma until just before discharge.

## 2023-02-11 NOTE — ED Notes (Signed)
Family assisting pt with bedpan

## 2023-02-14 DIAGNOSIS — I5022 Chronic systolic (congestive) heart failure: Secondary | ICD-10-CM | POA: Diagnosis not present

## 2023-02-17 ENCOUNTER — Telehealth: Payer: Self-pay

## 2023-02-17 DIAGNOSIS — M5136 Other intervertebral disc degeneration, lumbar region: Secondary | ICD-10-CM | POA: Diagnosis not present

## 2023-02-17 DIAGNOSIS — R482 Apraxia: Secondary | ICD-10-CM | POA: Diagnosis not present

## 2023-02-17 DIAGNOSIS — E039 Hypothyroidism, unspecified: Secondary | ICD-10-CM | POA: Diagnosis not present

## 2023-02-17 DIAGNOSIS — M6281 Muscle weakness (generalized): Secondary | ICD-10-CM | POA: Diagnosis not present

## 2023-02-17 DIAGNOSIS — B372 Candidiasis of skin and nail: Secondary | ICD-10-CM | POA: Diagnosis not present

## 2023-02-17 DIAGNOSIS — I5022 Chronic systolic (congestive) heart failure: Secondary | ICD-10-CM | POA: Diagnosis not present

## 2023-02-17 DIAGNOSIS — D0511 Intraductal carcinoma in situ of right breast: Secondary | ICD-10-CM | POA: Diagnosis not present

## 2023-02-17 DIAGNOSIS — I10 Essential (primary) hypertension: Secondary | ICD-10-CM | POA: Diagnosis not present

## 2023-02-17 DIAGNOSIS — E875 Hyperkalemia: Secondary | ICD-10-CM | POA: Diagnosis not present

## 2023-02-17 DIAGNOSIS — E1159 Type 2 diabetes mellitus with other circulatory complications: Secondary | ICD-10-CM | POA: Diagnosis not present

## 2023-02-17 DIAGNOSIS — E7800 Pure hypercholesterolemia, unspecified: Secondary | ICD-10-CM | POA: Diagnosis not present

## 2023-02-17 NOTE — Telephone Encounter (Signed)
Transition Care Management Unsuccessful Follow-up Telephone Call  Date of discharge and from where:  [facility]  Attempts:  2nd Attempt  Reason for unsuccessful TCM follow-up call:  No answer/busy   [facility] Guide, [facility] [phone] [address] [place], [place], [place] [address] Phone: [phone] Email: [email or web address]

## 2023-02-17 NOTE — Telephone Encounter (Signed)
Transition Care Management Unsuccessful Follow-up Telephone Call  Date of discharge and from where:  [facility]  Attempts:  1st Attempt  Reason for unsuccessful TCM follow-up call:  No answer/busy   [facility] Guide, [facility] [phone] [address] [place], [place], [place] [address] Phone: [phone] Email: [email or web address]

## 2023-03-08 DIAGNOSIS — I472 Ventricular tachycardia, unspecified: Secondary | ICD-10-CM | POA: Diagnosis not present

## 2023-03-08 DIAGNOSIS — Z4502 Encounter for adjustment and management of automatic implantable cardiac defibrillator: Secondary | ICD-10-CM | POA: Diagnosis not present

## 2023-03-08 DIAGNOSIS — R482 Apraxia: Secondary | ICD-10-CM | POA: Diagnosis not present

## 2023-03-08 DIAGNOSIS — R0902 Hypoxemia: Secondary | ICD-10-CM | POA: Diagnosis not present

## 2023-03-08 DIAGNOSIS — I5022 Chronic systolic (congestive) heart failure: Secondary | ICD-10-CM | POA: Diagnosis not present

## 2023-03-15 DIAGNOSIS — H353131 Nonexudative age-related macular degeneration, bilateral, early dry stage: Secondary | ICD-10-CM | POA: Diagnosis not present

## 2023-03-15 DIAGNOSIS — E119 Type 2 diabetes mellitus without complications: Secondary | ICD-10-CM | POA: Diagnosis not present

## 2023-03-15 DIAGNOSIS — H401132 Primary open-angle glaucoma, bilateral, moderate stage: Secondary | ICD-10-CM | POA: Diagnosis not present

## 2023-03-31 ENCOUNTER — Inpatient Hospital Stay: Payer: 59 | Attending: Hematology and Oncology | Admitting: Hematology and Oncology

## 2023-03-31 ENCOUNTER — Other Ambulatory Visit: Payer: Self-pay

## 2023-03-31 VITALS — BP 173/52 | HR 72 | Temp 97.8°F | Resp 18 | Ht 61.0 in | Wt 194.1 lb

## 2023-03-31 DIAGNOSIS — D0511 Intraductal carcinoma in situ of right breast: Secondary | ICD-10-CM | POA: Diagnosis not present

## 2023-03-31 DIAGNOSIS — Z79811 Long term (current) use of aromatase inhibitors: Secondary | ICD-10-CM | POA: Diagnosis not present

## 2023-03-31 DIAGNOSIS — Z170 Estrogen receptor positive status [ER+]: Secondary | ICD-10-CM | POA: Diagnosis not present

## 2023-03-31 NOTE — Assessment & Plan Note (Addendum)
11/27/2021: Screening mammogram detected microcalcifications and distortion in the right breast.  Additional mammograms and ultrasound revealed 5.6 cm area of calcifications.  Stereotactic biopsy is revealed minute foci of low-grade DCIS.  Duodenal biopsies showed fibrocystic changes and intraductal papilloma.  ER 100%, PR 100%, Ki-67 1%   Current treatment: Letrozole started 12/23/2021 Letrozole toxicities: Complains of joint stiffness but otherwise tolerating letrozole extremely well   Breast cancer surveillance: 1.  Breast exam 03/31/2023: Benign 2. Mammograms 10/15/2022: Interval resolution of the calcifications lateral portion of the right breast.  Spiculated mass lower inner quadrant right breast appears smaller   She and her family were from [place] Return to clinic in 1 year for follow-up

## 2023-03-31 NOTE — Progress Notes (Signed)
Patient Care Team: Irena Reichmann, DO as PCP - General (Family Medicine) Pershing Proud, RN as Oncology Nurse Navigator Donnelly Angelica, RN as Oncology Nurse Navigator  DIAGNOSIS:  Encounter Diagnosis  Name Primary?   Ductal carcinoma in situ (DCIS) of right breast Yes    SUMMARY OF ONCOLOGIC HISTORY: Oncology History  Ductal carcinoma in situ (DCIS) of right breast  11/27/2021 Initial Diagnosis   Screening mammogram detected microcalcifications and distortion in the right breast.  Additional mammograms and ultrasound revealed 5.6 cm area of calcifications.  Stereotactic biopsy is revealed minute foci of low-grade DCIS.  Duodenal biopsies showed fibrocystic changes and intraductal papilloma.  ER 100%, PR 100%, Ki-67 1%   12/23/2021 Cancer Staging   Staging form: Breast, AJCC 8th Edition - Clinical: Stage 0 (cTis (DCIS), cN0, cM0, ER+, PR+, HER2: Not Assessed) - Signed by Serena Croissant, MD on 12/23/2021 Stage prefix: Initial diagnosis Nuclear grade: G1     CHIEF COMPLIANT: Follow-up on antiestrogen therapy  Discussed the use of AI scribe software for clinical note transcription with the patient, who gave verbal consent to proceed.  History of Present Illness   The patient, with a history of calcium deposits, has been on medication for a year and three months. She reports no discomfort or pain and doesn't feel anything in the place where she used to feel pain. She is satisfied with the medication and plans to continue it for another year and three months. The patient's mammogram reports show a decrease in the previous calcium deposits. She is planning to travel to [place] for two months.         ALLERGIES:  is allergic to coconut (cocos nucifera), lisinopril, other, shellfish allergy, shrimp extract, sulfa antibiotics, and strawberry extract.  MEDICATIONS:  Current Outpatient Medications  Medication Sig Dispense Refill   amLODipine (NORVASC) 5 MG tablet Take 2.5 mg by mouth  daily.     atorvastatin (LIPITOR) 20 MG tablet Take 20 mg by mouth daily.     carvedilol (COREG) 3.125 MG tablet Take 3.125 mg by mouth 2 (two) times daily with a meal.     FLUoxetine (PROZAC) 20 MG capsule Take 20 mg by mouth every morning.     furosemide (LASIX) 40 MG tablet Take 40 mg by mouth daily.     glimepiride (AMARYL) 2 MG tablet Take 2 mg by mouth daily.     letrozole (FEMARA) 2.5 MG tablet TAKE 1 TABLET(2.5 MG) BY MOUTH DAILY (Patient taking differently: Take 2.5 mg by mouth daily.) 90 tablet 3   levothyroxine (SYNTHROID) 88 MCG tablet Take 88 mcg by mouth daily.     lisinopril (ZESTRIL) 20 MG tablet Take 20 mg by mouth daily.     nystatin cream (MYCOSTATIN) Apply to affected area 2 times daily 30 g 0   pioglitazone (ACTOS) 30 MG tablet Take 30 mg by mouth daily.     timolol (TIMOPTIC) 0.5 % ophthalmic solution Place 1 drop into both eyes 2 (two) times daily.     No current facility-administered medications for this visit.    PHYSICAL EXAMINATION: ECOG PERFORMANCE STATUS: 1 - Symptomatic but completely ambulatory  Vitals:   03/31/23 1317  BP: (!) 173/52  Pulse: 72  Resp: 18  Temp: 97.8 F (36.6 C)  SpO2: 95%   Filed Weights   03/31/23 1317  Weight: 194 lb 1.6 oz (88 kg)      LABORATORY DATA:  I have reviewed the data as listed    Latest Ref  Rng & Units 02/11/2023    2:29 PM 12/08/2022    2:20 PM 08/23/2019    5:59 PM  CMP  Glucose 70 - 99 mg/dL 161  096  045   BUN 8 - 23 mg/dL 44  44  38   Creatinine 0.44 - 1.00 mg/dL 4.09  8.11  9.14   Sodium 135 - 145 mmol/L 139  145  141   Potassium 3.5 - 5.1 mmol/L 5.3  4.8  4.3   Chloride 98 - 111 mmol/L 105  107  108   CO2 22 - 32 mmol/L 27  26  25    Calcium 8.9 - 10.3 mg/dL 9.1  9.6  9.1   Total Protein 6.0 - 8.3 g/dL  7.0    Total Bilirubin 0.2 - 1.2 mg/dL  0.4    Alkaline Phos 39 - 117 U/L  57    AST 0 - 37 U/L  14    ALT 0 - 35 U/L  10      Lab Results  Component Value Date   WBC 10.5 02/11/2023   HGB 13.0  02/11/2023   HCT 41.1 02/11/2023   MCV 92.2 02/11/2023   PLT 141 (L) 02/11/2023   NEUTROABS 7.0 12/08/2022    ASSESSMENT & PLAN:  Ductal carcinoma in situ (DCIS) of right breast 11/27/2021: Screening mammogram detected microcalcifications and distortion in the right breast.  Additional mammograms and ultrasound revealed 5.6 cm area of calcifications.  Stereotactic biopsy is revealed minute foci of low-grade DCIS.  Duodenal biopsies showed fibrocystic changes and intraductal papilloma.  ER 100%, PR 100%, Ki-67 1%   Current treatment: Letrozole started 12/23/2021 Letrozole toxicities: Complains of joint stiffness but otherwise tolerating letrozole extremely well   Breast cancer surveillance: 1.  Breast exam 03/31/2023: Benign 2. Mammograms 10/15/2022: Interval resolution of the calcifications lateral portion of the right breast.  Spiculated mass lower inner quadrant right breast appears smaller   She and her family were from [place]      Breast Calcifications Improvement noted on recent mammogram. No pain or discomfort reported. Currently on medication for 1 year and 3 months. -Continue current medication until December of next year. -Ensure sufficient medication refills are available.  General Health Maintenance -Annual follow-up appointment scheduled for next year.         No orders of the defined types were placed in this encounter.  The patient has a good understanding of the overall plan. she agrees with it. she will call with any problems that may develop before the next visit here. Total time spent: 30 mins including face to face time and time spent for planning, charting and co-ordination of care   Tamsen Meek, MD 03/31/23

## 2023-04-07 DIAGNOSIS — I5022 Chronic systolic (congestive) heart failure: Secondary | ICD-10-CM | POA: Diagnosis not present

## 2023-04-07 DIAGNOSIS — R482 Apraxia: Secondary | ICD-10-CM | POA: Diagnosis not present

## 2023-04-07 DIAGNOSIS — R0902 Hypoxemia: Secondary | ICD-10-CM | POA: Diagnosis not present

## 2023-04-28 DIAGNOSIS — M5432 Sciatica, left side: Secondary | ICD-10-CM | POA: Diagnosis not present

## 2023-04-28 DIAGNOSIS — M8008XD Age-related osteoporosis with current pathological fracture, vertebra(e), subsequent encounter for fracture with routine healing: Secondary | ICD-10-CM | POA: Diagnosis not present

## 2023-04-28 DIAGNOSIS — M5431 Sciatica, right side: Secondary | ICD-10-CM | POA: Diagnosis not present

## 2023-05-08 DIAGNOSIS — R0902 Hypoxemia: Secondary | ICD-10-CM | POA: Diagnosis not present

## 2023-05-08 DIAGNOSIS — I5022 Chronic systolic (congestive) heart failure: Secondary | ICD-10-CM | POA: Diagnosis not present

## 2023-05-08 DIAGNOSIS — R482 Apraxia: Secondary | ICD-10-CM | POA: Diagnosis not present

## 2023-06-07 DIAGNOSIS — R0902 Hypoxemia: Secondary | ICD-10-CM | POA: Diagnosis not present

## 2023-06-07 DIAGNOSIS — R482 Apraxia: Secondary | ICD-10-CM | POA: Diagnosis not present

## 2023-06-07 DIAGNOSIS — I5022 Chronic systolic (congestive) heart failure: Secondary | ICD-10-CM | POA: Diagnosis not present

## 2023-07-08 DIAGNOSIS — R482 Apraxia: Secondary | ICD-10-CM | POA: Diagnosis not present

## 2023-07-08 DIAGNOSIS — R0902 Hypoxemia: Secondary | ICD-10-CM | POA: Diagnosis not present

## 2023-07-08 DIAGNOSIS — I5022 Chronic systolic (congestive) heart failure: Secondary | ICD-10-CM | POA: Diagnosis not present

## 2023-08-08 DIAGNOSIS — I5022 Chronic systolic (congestive) heart failure: Secondary | ICD-10-CM | POA: Diagnosis not present

## 2023-08-08 DIAGNOSIS — R482 Apraxia: Secondary | ICD-10-CM | POA: Diagnosis not present

## 2023-08-08 DIAGNOSIS — R0902 Hypoxemia: Secondary | ICD-10-CM | POA: Diagnosis not present

## 2023-08-15 DIAGNOSIS — I5022 Chronic systolic (congestive) heart failure: Secondary | ICD-10-CM | POA: Diagnosis not present

## 2023-08-17 DIAGNOSIS — I5022 Chronic systolic (congestive) heart failure: Secondary | ICD-10-CM | POA: Diagnosis not present

## 2023-08-17 DIAGNOSIS — E1159 Type 2 diabetes mellitus with other circulatory complications: Secondary | ICD-10-CM | POA: Diagnosis not present

## 2023-08-17 DIAGNOSIS — E039 Hypothyroidism, unspecified: Secondary | ICD-10-CM | POA: Diagnosis not present

## 2023-08-17 DIAGNOSIS — I10 Essential (primary) hypertension: Secondary | ICD-10-CM | POA: Diagnosis not present

## 2023-09-05 DIAGNOSIS — I5022 Chronic systolic (congestive) heart failure: Secondary | ICD-10-CM | POA: Diagnosis not present

## 2023-09-05 DIAGNOSIS — R0902 Hypoxemia: Secondary | ICD-10-CM | POA: Diagnosis not present

## 2023-09-05 DIAGNOSIS — R482 Apraxia: Secondary | ICD-10-CM | POA: Diagnosis not present

## 2023-09-06 DIAGNOSIS — E039 Hypothyroidism, unspecified: Secondary | ICD-10-CM | POA: Diagnosis not present

## 2023-09-06 DIAGNOSIS — I5022 Chronic systolic (congestive) heart failure: Secondary | ICD-10-CM | POA: Diagnosis not present

## 2023-09-06 DIAGNOSIS — Z950 Presence of cardiac pacemaker: Secondary | ICD-10-CM | POA: Diagnosis not present

## 2023-09-06 DIAGNOSIS — E1159 Type 2 diabetes mellitus with other circulatory complications: Secondary | ICD-10-CM | POA: Diagnosis not present

## 2023-09-06 DIAGNOSIS — M4807 Spinal stenosis, lumbosacral region: Secondary | ICD-10-CM | POA: Diagnosis not present

## 2023-09-06 DIAGNOSIS — K219 Gastro-esophageal reflux disease without esophagitis: Secondary | ICD-10-CM | POA: Diagnosis not present

## 2023-09-06 DIAGNOSIS — R482 Apraxia: Secondary | ICD-10-CM | POA: Diagnosis not present

## 2023-09-06 DIAGNOSIS — M51362 Other intervertebral disc degeneration, lumbar region with discogenic back pain and lower extremity pain: Secondary | ICD-10-CM | POA: Diagnosis not present

## 2023-09-06 DIAGNOSIS — I2510 Atherosclerotic heart disease of native coronary artery without angina pectoris: Secondary | ICD-10-CM | POA: Diagnosis not present

## 2023-09-06 DIAGNOSIS — E7800 Pure hypercholesterolemia, unspecified: Secondary | ICD-10-CM | POA: Diagnosis not present

## 2023-09-06 DIAGNOSIS — Z0000 Encounter for general adult medical examination without abnormal findings: Secondary | ICD-10-CM | POA: Diagnosis not present

## 2023-09-16 ENCOUNTER — Other Ambulatory Visit: Payer: Self-pay | Admitting: Hematology and Oncology

## 2023-09-16 DIAGNOSIS — R921 Mammographic calcification found on diagnostic imaging of breast: Secondary | ICD-10-CM

## 2023-09-19 DIAGNOSIS — E119 Type 2 diabetes mellitus without complications: Secondary | ICD-10-CM | POA: Diagnosis not present

## 2023-09-19 DIAGNOSIS — H353131 Nonexudative age-related macular degeneration, bilateral, early dry stage: Secondary | ICD-10-CM | POA: Diagnosis not present

## 2023-09-19 DIAGNOSIS — Z961 Presence of intraocular lens: Secondary | ICD-10-CM | POA: Diagnosis not present

## 2023-09-19 DIAGNOSIS — H401132 Primary open-angle glaucoma, bilateral, moderate stage: Secondary | ICD-10-CM | POA: Diagnosis not present

## 2023-10-06 DIAGNOSIS — R0902 Hypoxemia: Secondary | ICD-10-CM | POA: Diagnosis not present

## 2023-10-06 DIAGNOSIS — I5022 Chronic systolic (congestive) heart failure: Secondary | ICD-10-CM | POA: Diagnosis not present

## 2023-10-06 DIAGNOSIS — R482 Apraxia: Secondary | ICD-10-CM | POA: Diagnosis not present

## 2023-10-17 ENCOUNTER — Ambulatory Visit
Admission: RE | Admit: 2023-10-17 | Discharge: 2023-10-17 | Disposition: A | Discharge status: Home / Self Care | Source: Ambulatory Visit | Attending: Hematology and Oncology | Admitting: Hematology and Oncology

## 2023-10-17 DIAGNOSIS — R921 Mammographic calcification found on diagnostic imaging of breast: Secondary | ICD-10-CM

## 2023-10-17 DIAGNOSIS — D0511 Intraductal carcinoma in situ of right breast: Secondary | ICD-10-CM | POA: Diagnosis not present

## 2023-11-05 DIAGNOSIS — R0902 Hypoxemia: Secondary | ICD-10-CM | POA: Diagnosis not present

## 2023-11-05 DIAGNOSIS — I5022 Chronic systolic (congestive) heart failure: Secondary | ICD-10-CM | POA: Diagnosis not present

## 2023-11-05 DIAGNOSIS — R482 Apraxia: Secondary | ICD-10-CM | POA: Diagnosis not present

## 2023-11-14 DIAGNOSIS — I5022 Chronic systolic (congestive) heart failure: Secondary | ICD-10-CM | POA: Diagnosis not present

## 2023-11-21 DIAGNOSIS — I5022 Chronic systolic (congestive) heart failure: Secondary | ICD-10-CM | POA: Diagnosis not present

## 2023-11-21 DIAGNOSIS — Z4502 Encounter for adjustment and management of automatic implantable cardiac defibrillator: Secondary | ICD-10-CM | POA: Diagnosis not present

## 2023-12-06 DIAGNOSIS — R482 Apraxia: Secondary | ICD-10-CM | POA: Diagnosis not present

## 2023-12-06 DIAGNOSIS — I5022 Chronic systolic (congestive) heart failure: Secondary | ICD-10-CM | POA: Diagnosis not present

## 2023-12-06 DIAGNOSIS — R0902 Hypoxemia: Secondary | ICD-10-CM | POA: Diagnosis not present

## 2023-12-16 ENCOUNTER — Other Ambulatory Visit: Payer: Self-pay | Admitting: Hematology and Oncology

## 2024-01-05 DIAGNOSIS — R0902 Hypoxemia: Secondary | ICD-10-CM | POA: Diagnosis not present

## 2024-01-05 DIAGNOSIS — I5022 Chronic systolic (congestive) heart failure: Secondary | ICD-10-CM | POA: Diagnosis not present

## 2024-01-05 DIAGNOSIS — R482 Apraxia: Secondary | ICD-10-CM | POA: Diagnosis not present

## 2024-02-05 DIAGNOSIS — R0902 Hypoxemia: Secondary | ICD-10-CM | POA: Diagnosis not present

## 2024-02-05 DIAGNOSIS — I5022 Chronic systolic (congestive) heart failure: Secondary | ICD-10-CM | POA: Diagnosis not present

## 2024-02-05 DIAGNOSIS — R482 Apraxia: Secondary | ICD-10-CM | POA: Diagnosis not present

## 2024-02-09 DIAGNOSIS — I5022 Chronic systolic (congestive) heart failure: Secondary | ICD-10-CM | POA: Diagnosis not present

## 2024-02-09 DIAGNOSIS — Z4502 Encounter for adjustment and management of automatic implantable cardiac defibrillator: Secondary | ICD-10-CM | POA: Diagnosis not present

## 2024-02-09 DIAGNOSIS — I498 Other specified cardiac arrhythmias: Secondary | ICD-10-CM | POA: Diagnosis not present

## 2024-02-10 DIAGNOSIS — Z95810 Presence of automatic (implantable) cardiac defibrillator: Secondary | ICD-10-CM | POA: Diagnosis not present

## 2024-02-16 DIAGNOSIS — Z7984 Long term (current) use of oral hypoglycemic drugs: Secondary | ICD-10-CM | POA: Diagnosis not present

## 2024-02-16 DIAGNOSIS — E1151 Type 2 diabetes mellitus with diabetic peripheral angiopathy without gangrene: Secondary | ICD-10-CM | POA: Diagnosis not present

## 2024-02-16 DIAGNOSIS — Z4502 Encounter for adjustment and management of automatic implantable cardiac defibrillator: Secondary | ICD-10-CM | POA: Diagnosis not present

## 2024-02-28 DIAGNOSIS — I5022 Chronic systolic (congestive) heart failure: Secondary | ICD-10-CM | POA: Diagnosis not present

## 2024-03-06 DIAGNOSIS — E1159 Type 2 diabetes mellitus with other circulatory complications: Secondary | ICD-10-CM | POA: Diagnosis not present

## 2024-03-06 DIAGNOSIS — K219 Gastro-esophageal reflux disease without esophagitis: Secondary | ICD-10-CM | POA: Diagnosis not present

## 2024-03-06 DIAGNOSIS — E039 Hypothyroidism, unspecified: Secondary | ICD-10-CM | POA: Diagnosis not present

## 2024-03-06 DIAGNOSIS — E7800 Pure hypercholesterolemia, unspecified: Secondary | ICD-10-CM | POA: Diagnosis not present

## 2024-03-07 DIAGNOSIS — R482 Apraxia: Secondary | ICD-10-CM | POA: Diagnosis not present

## 2024-03-07 DIAGNOSIS — R0902 Hypoxemia: Secondary | ICD-10-CM | POA: Diagnosis not present

## 2024-03-13 DIAGNOSIS — I129 Hypertensive chronic kidney disease with stage 1 through stage 4 chronic kidney disease, or unspecified chronic kidney disease: Secondary | ICD-10-CM | POA: Diagnosis not present

## 2024-03-13 DIAGNOSIS — R482 Apraxia: Secondary | ICD-10-CM | POA: Diagnosis not present

## 2024-03-13 DIAGNOSIS — N1832 Chronic kidney disease, stage 3b: Secondary | ICD-10-CM | POA: Diagnosis not present

## 2024-03-13 DIAGNOSIS — I255 Ischemic cardiomyopathy: Secondary | ICD-10-CM | POA: Diagnosis not present

## 2024-03-13 DIAGNOSIS — N3281 Overactive bladder: Secondary | ICD-10-CM | POA: Diagnosis not present

## 2024-03-13 DIAGNOSIS — E1159 Type 2 diabetes mellitus with other circulatory complications: Secondary | ICD-10-CM | POA: Diagnosis not present

## 2024-03-13 DIAGNOSIS — E039 Hypothyroidism, unspecified: Secondary | ICD-10-CM | POA: Diagnosis not present

## 2024-03-13 DIAGNOSIS — Z950 Presence of cardiac pacemaker: Secondary | ICD-10-CM | POA: Diagnosis not present

## 2024-03-13 DIAGNOSIS — I5022 Chronic systolic (congestive) heart failure: Secondary | ICD-10-CM | POA: Diagnosis not present

## 2024-03-13 DIAGNOSIS — I2510 Atherosclerotic heart disease of native coronary artery without angina pectoris: Secondary | ICD-10-CM | POA: Diagnosis not present

## 2024-03-13 DIAGNOSIS — B372 Candidiasis of skin and nail: Secondary | ICD-10-CM | POA: Diagnosis not present

## 2024-03-13 DIAGNOSIS — E1122 Type 2 diabetes mellitus with diabetic chronic kidney disease: Secondary | ICD-10-CM | POA: Diagnosis not present

## 2024-04-02 ENCOUNTER — Inpatient Hospital Stay: Payer: 59 | Attending: Hematology and Oncology | Admitting: Hematology and Oncology

## 2024-04-02 VITALS — BP 154/40 | HR 62 | Temp 98.0°F | Resp 18 | Ht 61.0 in | Wt 188.1 lb

## 2024-04-02 DIAGNOSIS — Z950 Presence of cardiac pacemaker: Secondary | ICD-10-CM | POA: Insufficient documentation

## 2024-04-02 DIAGNOSIS — Z170 Estrogen receptor positive status [ER+]: Secondary | ICD-10-CM | POA: Insufficient documentation

## 2024-04-02 DIAGNOSIS — D0511 Intraductal carcinoma in situ of right breast: Secondary | ICD-10-CM | POA: Insufficient documentation

## 2024-04-02 DIAGNOSIS — Z79811 Long term (current) use of aromatase inhibitors: Secondary | ICD-10-CM | POA: Diagnosis not present

## 2024-04-02 NOTE — Assessment & Plan Note (Signed)
 11/27/2021: Screening mammogram detected microcalcifications and distortion in the right breast.  Additional mammograms and ultrasound revealed 5.6 cm area of calcifications.  Stereotactic biopsy is revealed minute foci of low-grade DCIS.  Duodenal biopsies showed fibrocystic changes and intraductal papilloma.  ER 100%, PR 100%, Ki-67 1%   Current treatment: Letrozole  started 12/23/2021 Letrozole  toxicities: Complains of joint stiffness but otherwise tolerating letrozole  extremely well   Breast cancer surveillance: 1.  Breast exam 04/02/2024: Benign 2. Mammograms 10/17/2023: Low-grade DCIS medial right breast: Unchanged She and her family were from [place]

## 2024-04-02 NOTE — Progress Notes (Signed)
 Patient Care Team: Gerome Brunet, DO as PCP - General (Family Medicine) Tyree Nanetta SAILOR, RN as Oncology Nurse Navigator  DIAGNOSIS:  Encounter Diagnosis  Name Primary?   Ductal carcinoma in situ (DCIS) of right breast Yes    SUMMARY OF ONCOLOGIC HISTORY: Oncology History  Ductal carcinoma in situ (DCIS) of right breast  11/27/2021 Initial Diagnosis   Screening mammogram detected microcalcifications and distortion in the right breast.  Additional mammograms and ultrasound revealed 5.6 cm area of calcifications.  Stereotactic biopsy is revealed minute foci of low-grade DCIS.  Duodenal biopsies showed fibrocystic changes and intraductal papilloma.  ER 100%, PR 100%, Ki-67 1%   12/23/2021 Cancer Staging   Staging form: Breast, AJCC 8th Edition - Clinical: Stage 0 (cTis (DCIS), cN0, cM0, ER+, PR+, HER2: Not Assessed) - Signed by Odean Potts, MD on 12/23/2021 Stage prefix: Initial diagnosis Nuclear grade: G1     CHIEF COMPLIANT: Surveillance of DCIS  HISTORY OF PRESENT ILLNESS:  History of Present Illness [name] is a 89 year old female who presents for follow-up after pacemaker replacement and ongoing hormone therapy.  She has been on hormone therapy for approximately two and a half years without significant side effects, including no hot flashes.  She underwent a pacemaker replacement one month ago, during which the previous pacemaker's battery was changed. She inquires about the longevity of the new device.  She has not traveled recently but is planning a trip back to [place]. She expresses apprehension about traveling due to safety concerns. She and her husband have an apartment there and have spent time there previously without issues.  She reports a good appetite and enjoys a variety of foods prepared by her brother, including chicken, beans, and meats. She eats well and feels strong.  She mentions not sleeping well the previous night and feeling nervous, which may  have affected her blood pressure readings. Drinking more water helps stabilize her blood pressure, but she is cautious about fluid intake due to frequent urination.     ALLERGIES:  is allergic to coconut (cocos nucifera), lisinopril, other, shellfish allergy, shrimp extract, sulfa antibiotics, and strawberry extract.  MEDICATIONS:  Current Outpatient Medications  Medication Sig Dispense Refill   amLODipine (NORVASC) 5 MG tablet Take 2.5 mg by mouth daily.     atorvastatin (LIPITOR) 20 MG tablet Take 20 mg by mouth daily.     carvedilol (COREG) 3.125 MG tablet Take 3.125 mg by mouth 2 (two) times daily with a meal.     FLUoxetine (PROZAC) 20 MG capsule Take 20 mg by mouth every morning.     furosemide (LASIX) 40 MG tablet Take 40 mg by mouth daily.     glimepiride (AMARYL) 2 MG tablet Take 2 mg by mouth daily.     letrozole  (FEMARA ) 2.5 MG tablet Take 1 tablet (2.5 mg total) by mouth daily. 90 tablet 3   levothyroxine (SYNTHROID) 88 MCG tablet Take 88 mcg by mouth daily.     lisinopril (ZESTRIL) 20 MG tablet Take 20 mg by mouth daily.     nystatin  cream (MYCOSTATIN ) Apply to affected area 2 times daily 30 g 0   pioglitazone (ACTOS) 30 MG tablet Take 30 mg by mouth daily.     timolol (TIMOPTIC) 0.5 % ophthalmic solution Place 1 drop into both eyes 2 (two) times daily.     No current facility-administered medications for this visit.    PHYSICAL EXAMINATION: ECOG PERFORMANCE STATUS: 1 - Symptomatic but completely ambulatory  Vitals:  04/02/24 1405 04/02/24 1407  BP: (!) 163/40 (!) 154/40  Pulse: 65 62  Resp: 18   Temp: 98 F (36.7 C)   SpO2: 96%    Filed Weights   04/02/24 1405  Weight: 188 lb 1.6 oz (85.3 kg)   LABORATORY DATA:  I have reviewed the data as listed    Latest Ref Rng & Units 02/11/2023    2:29 PM 12/08/2022    2:20 PM 08/23/2019    5:59 PM  CMP  Glucose 70 - 99 mg/dL 834  854  831   BUN 8 - 23 mg/dL 44  44  38   Creatinine 0.44 - 1.00 mg/dL 9.03  9.01  9.02    Sodium 135 - 145 mmol/L 139  145  141   Potassium 3.5 - 5.1 mmol/L 5.3  4.8  4.3   Chloride 98 - 111 mmol/L 105  107  108   CO2 22 - 32 mmol/L 27  26  25    Calcium 8.9 - 10.3 mg/dL 9.1  9.6  9.1   Total Protein 6.0 - 8.3 g/dL  7.0    Total Bilirubin 0.2 - 1.2 mg/dL  0.4    Alkaline Phos 39 - 117 U/L  57    AST 0 - 37 U/L  14    ALT 0 - 35 U/L  10      Lab Results  Component Value Date   WBC 10.5 02/11/2023   HGB 13.0 02/11/2023   HCT 41.1 02/11/2023   MCV 92.2 02/11/2023   PLT 141 (L) 02/11/2023   NEUTROABS 7.0 12/08/2022    ASSESSMENT & PLAN:  Ductal carcinoma in situ (DCIS) of right breast 11/27/2021: Screening mammogram detected microcalcifications and distortion in the right breast.  Additional mammograms and ultrasound revealed 5.6 cm area of calcifications.  Stereotactic biopsy is revealed minute foci of low-grade DCIS.  Duodenal biopsies showed fibrocystic changes and intraductal papilloma.  ER 100%, PR 100%, Ki-67 1%   Current treatment: Letrozole  started 12/23/2021 Letrozole  toxicities: Complains of joint stiffness but otherwise tolerating letrozole  extremely well   Breast cancer surveillance: Mammograms 10/17/2023: Low-grade DCIS medial right breast: Unchanged [place]  We discussed the pros and cons of continuing mammograms surveillance and anastrozole therapy. She wishes to continue on antiestrogen therapy and keep the mammograms as well.  Return to clinic in 1 year for follow-up   No orders of the defined types were placed in this encounter.  The patient has a good understanding of the overall plan. she agrees with it. she will call with any problems that may develop before the next visit here. Total time spent: 30 mins including face to face time and time spent for planning, charting and co-ordination of care   Veronica MARLA Chad, MD 04/02/24

## 2024-04-03 DIAGNOSIS — Z961 Presence of intraocular lens: Secondary | ICD-10-CM | POA: Diagnosis not present

## 2024-04-03 DIAGNOSIS — H401132 Primary open-angle glaucoma, bilateral, moderate stage: Secondary | ICD-10-CM | POA: Diagnosis not present

## 2024-04-03 DIAGNOSIS — E119 Type 2 diabetes mellitus without complications: Secondary | ICD-10-CM | POA: Diagnosis not present

## 2024-04-03 DIAGNOSIS — H353132 Nonexudative age-related macular degeneration, bilateral, intermediate dry stage: Secondary | ICD-10-CM | POA: Diagnosis not present

## 2024-04-06 DIAGNOSIS — I5022 Chronic systolic (congestive) heart failure: Secondary | ICD-10-CM | POA: Diagnosis not present

## 2024-04-06 DIAGNOSIS — R0902 Hypoxemia: Secondary | ICD-10-CM | POA: Diagnosis not present

## 2024-04-06 DIAGNOSIS — R482 Apraxia: Secondary | ICD-10-CM | POA: Diagnosis not present

## 2024-04-12 DIAGNOSIS — M5431 Sciatica, right side: Secondary | ICD-10-CM | POA: Diagnosis not present

## 2024-04-12 DIAGNOSIS — M4726 Other spondylosis with radiculopathy, lumbar region: Secondary | ICD-10-CM | POA: Diagnosis not present

## 2024-04-12 DIAGNOSIS — M5432 Sciatica, left side: Secondary | ICD-10-CM | POA: Diagnosis not present

## 2024-04-12 DIAGNOSIS — M48062 Spinal stenosis, lumbar region with neurogenic claudication: Secondary | ICD-10-CM | POA: Diagnosis not present

## 2024-06-07 ENCOUNTER — Emergency Department (HOSPITAL_BASED_OUTPATIENT_CLINIC_OR_DEPARTMENT_OTHER): Admitting: Radiology

## 2024-06-07 ENCOUNTER — Emergency Department (HOSPITAL_BASED_OUTPATIENT_CLINIC_OR_DEPARTMENT_OTHER)

## 2024-06-07 ENCOUNTER — Emergency Department (HOSPITAL_BASED_OUTPATIENT_CLINIC_OR_DEPARTMENT_OTHER)
Admission: EM | Admit: 2024-06-07 | Discharge: 2024-06-07 | Disposition: A | Discharge status: Home / Self Care | Attending: Emergency Medicine | Admitting: Emergency Medicine

## 2024-06-07 ENCOUNTER — Other Ambulatory Visit: Payer: Self-pay

## 2024-06-07 DIAGNOSIS — I10 Essential (primary) hypertension: Secondary | ICD-10-CM | POA: Insufficient documentation

## 2024-06-07 DIAGNOSIS — I2510 Atherosclerotic heart disease of native coronary artery without angina pectoris: Secondary | ICD-10-CM | POA: Insufficient documentation

## 2024-06-07 DIAGNOSIS — M545 Low back pain, unspecified: Secondary | ICD-10-CM | POA: Insufficient documentation

## 2024-06-07 DIAGNOSIS — E119 Type 2 diabetes mellitus without complications: Secondary | ICD-10-CM | POA: Insufficient documentation

## 2024-06-07 DIAGNOSIS — S0990XA Unspecified injury of head, initial encounter: Secondary | ICD-10-CM | POA: Insufficient documentation

## 2024-06-07 DIAGNOSIS — W19XXXA Unspecified fall, initial encounter: Secondary | ICD-10-CM | POA: Insufficient documentation

## 2024-06-07 DIAGNOSIS — Z7984 Long term (current) use of oral hypoglycemic drugs: Secondary | ICD-10-CM | POA: Insufficient documentation

## 2024-06-07 DIAGNOSIS — M546 Pain in thoracic spine: Secondary | ICD-10-CM | POA: Insufficient documentation

## 2024-06-07 DIAGNOSIS — Z79899 Other long term (current) drug therapy: Secondary | ICD-10-CM | POA: Insufficient documentation

## 2024-06-07 DIAGNOSIS — Z950 Presence of cardiac pacemaker: Secondary | ICD-10-CM | POA: Insufficient documentation

## 2024-06-07 DIAGNOSIS — M25512 Pain in left shoulder: Secondary | ICD-10-CM | POA: Insufficient documentation

## 2024-06-07 DIAGNOSIS — R0781 Pleurodynia: Secondary | ICD-10-CM | POA: Insufficient documentation

## 2024-06-07 DIAGNOSIS — R7989 Other specified abnormal findings of blood chemistry: Secondary | ICD-10-CM | POA: Insufficient documentation

## 2024-06-07 LAB — BASIC METABOLIC PANEL WITH GFR
Anion gap: 10 (ref 5–15)
BUN: 39 mg/dL — ABNORMAL HIGH (ref 8–23)
CO2: 29 mmol/L (ref 22–32)
Calcium: 9.8 mg/dL (ref 8.9–10.3)
Chloride: 105 mmol/L (ref 98–111)
Creatinine, Ser: 1.11 mg/dL — ABNORMAL HIGH (ref 0.44–1.00)
GFR, Estimated: 46 mL/min — ABNORMAL LOW (ref >60)
Glucose, Bld: 120 mg/dL — ABNORMAL HIGH (ref 70–99)
Potassium: 4.2 mmol/L (ref 3.5–5.1)
Sodium: 143 mmol/L (ref 135–145)

## 2024-06-07 LAB — CBC WITH DIFFERENTIAL/PLATELET
Abs Immature Granulocytes: 0.07 K/uL (ref 0.00–0.07)
Basophils Absolute: 0 K/uL (ref 0.0–0.1)
Basophils Relative: 0 %
Eosinophils Absolute: 0.2 K/uL (ref 0.0–0.5)
Eosinophils Relative: 2 %
HCT: 38.9 % (ref 36.0–46.0)
Hemoglobin: 12.6 g/dL (ref 12.0–15.0)
Immature Granulocytes: 1 %
Lymphocytes Relative: 21 %
Lymphs Abs: 2.2 K/uL (ref 0.7–4.0)
MCH: 29 pg (ref 26.0–34.0)
MCHC: 32.4 g/dL (ref 30.0–36.0)
MCV: 89.4 fL (ref 80.0–100.0)
Monocytes Absolute: 1.1 K/uL — ABNORMAL HIGH (ref 0.1–1.0)
Monocytes Relative: 11 %
Neutro Abs: 6.6 K/uL (ref 1.7–7.7)
Neutrophils Relative %: 65 %
Platelets: 181 K/uL (ref 150–400)
RBC: 4.35 MIL/uL (ref 3.87–5.11)
RDW: 15.9 % — ABNORMAL HIGH (ref 11.5–15.5)
WBC: 10.2 K/uL (ref 4.0–10.5)
nRBC: 0 % (ref 0.0–0.2)

## 2024-06-07 MED ORDER — IBUPROFEN 200 MG PO TABS: 200.0000 mg | ORAL_TABLET | Freq: Four times a day (QID) | ORAL | 0 refills | Status: AC | PRN

## 2024-06-07 MED ORDER — LIDOCAINE 5 % EX PTCH: 1.0000 | MEDICATED_PATCH | CUTANEOUS | 0 refills | Status: AC

## 2024-06-07 MED ORDER — IOHEXOL 300 MG/ML  SOLN
75.0000 mL | Freq: Once | INTRAMUSCULAR | Status: AC | PRN
  Administered 2024-06-07: 75 mL via INTRAVENOUS

## 2024-06-07 MED ORDER — KETOROLAC TROMETHAMINE 15 MG/ML IJ SOLN
7.5000 mg | Freq: Once | INTRAMUSCULAR | Status: AC
  Administered 2024-06-07: 7.5 mg via INTRAVENOUS
  Filled 2024-06-07: qty 1

## 2024-06-07 NOTE — ED Triage Notes (Signed)
 C/o mechanical fall on Tuesday. Denies hitting head. C/o left shoulder pain.

## 2024-06-07 NOTE — ED Provider Notes (Addendum)
 [place] [facility] AT [facility] Provider Note   CSN: [id number] Arrival date & time: 06/07/24  1607     Patient presents with: [name] is a 89 y.o. female.   The history is provided by the patient, a relative and a caregiver. The history is limited by a language barrier. A language interpreter was used.  Fall  Patient is a 89 year old female presenting to the ED with son at bedside for concerns for left shoulder pain, left-sided scapular/rib pain post mechanical fall 2 days ago, noting to have lost her balance while on the toilet, with her son at her side who helped slow her descent but did hit her back against tub.  Since then has had progressive left-sided shoulder and left-sided rib pain.  Noted to have some lumbar pain as well, similar to chronic.  Son reports that he believes that she did not hit her head, patient not certain whether or not she hit her head or not.   Notes that she had been seeing spine surgery for chronic low back pain, referred to home health.  Noted to be barely ambulatory at that time.  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  Denies fever, saddle paresthesias, fecal/urinary incontinence,  Denies numbness, weakness, tingling, headache, vision changes, chest pain, shortness of breath, abdominal pain, nausea, vomiting.     Prior to Admission medications   Medication Sig Start Date End Date Taking? Authorizing Provider  ibuprofen  (ADVIL ) 200 MG tablet Take 1 tablet (200 mg total) by mouth every 6 (six) hours as needed. 06/07/24  Yes Zahrah Sutherlin S, PA-C  lidocaine  (LIDODERM ) 5 % Place 1 patch onto the skin daily. Remove & Discard patch within 12 hours or as directed by MD 06/07/24  Yes Beola, Phallon Haydu S, PA-C  amLODipine (NORVASC) 5 MG tablet Take 2.5 mg by mouth daily.    [provider]  atorvastatin (LIPITOR) 20 MG  tablet Take 20 mg by mouth daily.    [provider]  carvedilol (COREG) 3.125 MG tablet Take 3.125 mg by mouth 2 (two) times daily with a meal. 07/26/12   [provider]  FLUoxetine (PROZAC) 20 MG capsule Take 20 mg by mouth every morning. 07/30/19   [provider]  furosemide (LASIX) 40 MG tablet Take 40 mg by mouth daily. 02/02/23   [provider]  glimepiride (AMARYL) 2 MG tablet Take 2 mg by mouth daily. 10/08/19   [provider]  letrozole  (FEMARA ) 2.5 MG tablet Take 1 tablet (2.5 mg total) by mouth daily. 12/19/23   Gudena, Vinay, MD  levothyroxine (SYNTHROID) 88 MCG tablet Take 88 mcg by mouth daily. 08/31/19   [provider]  lisinopril (ZESTRIL) 20 MG tablet Take 20 mg by mouth daily.    [provider]  nystatin  cream (MYCOSTATIN ) Apply to affected area 2 times daily 02/11/23   Elnor Savant A, DO  pioglitazone (ACTOS) 30 MG tablet Take 30 mg by mouth daily. 02/09/23   [provider]  timolol (TIMOPTIC) 0.5 % ophthalmic solution Place 1 drop into both eyes 2 (two) times daily. 09/07/19   [provider]    Allergies: Coconut (cocos nucifera), Lisinopril, Other, Shellfish allergy, Shrimp extract, Sulfa antibiotics, and Strawberry extract    Review of Systems  Musculoskeletal:  Positive for back pain.  All other systems reviewed and are negative.   Updated Vital Signs  BP (!) 146/55   Pulse 60   Temp (!) 97.5 F (36.4 C)   Resp 18   SpO2 99%   Physical Exam Vitals and nursing note reviewed.  Constitutional:      General: She is not in acute distress.    Appearance: Normal appearance. She is not ill-appearing or diaphoretic.  HENT:     Head: Normocephalic and atraumatic.  Eyes:     General: No scleral icterus.       Right eye: No discharge.        Left eye: No discharge.     Extraocular Movements: Extraocular movements intact.     Conjunctiva/sclera: Conjunctivae normal.  Neck:     Comments: No  cervical midline tenderness to palpation. Cardiovascular:     Rate and Rhythm: Normal rate and regular rhythm.     Pulses: Normal pulses.     Heart sounds: Normal heart sounds. No murmur heard.    No friction rub. No gallop.  Pulmonary:     Effort: Pulmonary effort is normal. No respiratory distress.     Breath sounds: No stridor. No wheezing, rhonchi or rales.  Chest:     Chest wall: No tenderness.  Abdominal:     General: Abdomen is flat. There is no distension.     Palpations: Abdomen is soft.     Tenderness: There is no abdominal tenderness. There is no right CVA tenderness, left CVA tenderness, guarding or rebound.  Musculoskeletal:        General: Tenderness (Notably tender over scapula, midline thoracic, midline lumbar, left ribs.) and deformity (Chronic left humeral deformity) present. No swelling or signs of injury.     Cervical back: Normal range of motion. No rigidity or tenderness.     Right lower leg: No edema.     Left lower leg: No edema.  Skin:    General: Skin is warm and dry.     Findings: No bruising, erythema or lesion.  Neurological:     General: No focal deficit present.     Mental Status: She is alert and oriented to person, place, and time. Mental status is at baseline.     Sensory: No sensory deficit.     Motor: No weakness.  Psychiatric:        Mood and Affect: Mood normal.     (all labs ordered are listed, but only abnormal results are displayed) Labs Reviewed  CBC WITH DIFFERENTIAL/PLATELET - Abnormal; Notable for the following components:      Result Value   RDW 15.9 (*)    Monocytes Absolute 1.1 (*)    All other components within normal limits  BASIC METABOLIC PANEL WITH GFR - Abnormal; Notable for the following components:   Glucose, Bld 120 (*)    BUN 39 (*)    Creatinine, Ser 1.11 (*)    GFR, Estimated 46 (*)    All other components within normal limits    EKG: None  Radiology: CT Chest W Contrast Result Date: 06/07/2024 EXAM: CT  CHEST WITH CONTRAST 06/07/2024 09:03:59 PM TECHNIQUE: CT of the chest was performed with the administration of 75 mL of iohexol (OMNIPAQUE) 300 MG/ML solution. Multiplanar reformatted images are provided for review. Automated exposure control, iterative reconstruction, and/or weight based adjustment of the mA/kV was utilized to reduce the radiation dose to as low as reasonably achievable. COMPARISON: Findings compared to a prior examination of 06/26/2010. CLINICAL HISTORY: Chest trauma, blunt. FINDINGS: MEDIASTINUM: Moderate multivessel coronary artery calcifications. Moderate cardiomegaly. Left  subclavian 3 lead pacemaker in place with leads within the left ventricular venous outflow, right atrium, and right ventricle. No pericardial effusion. The central pulmonary arteries are enlarged in keeping with changes of pulmonary arterial hypertension. Extensive atherosclerotic calcification within the thoracic aorta. No aortic aneurysm. Bilateral heterogeneously enhancing nodules are seen posterior to the thyroid  gland, which may represent exophytic thyroid  nodules or parathyroid nodules. This measures up to 3.0 cm on the left and 2.5 cm on the right and are indeterminate. Dedicated thyroid  sonography is recommended for further characterization if clinically indicated. The central airways are clear. Esophagus unremarkable. LYMPH NODES: No pathologic thoracic adenopathy. No mediastinal, hilar or axillary lymphadenopathy. LUNGS AND PLEURA: A 6 mm subpleural nodule within the right lower lobe is stable since the prior examination and is simply considered benign. The lungs are otherwise clear. No pneumothorax or pleural effusion. SOFT TISSUES/BONES: Osseous structures are age-appropriate. No acute bone abnormality. No lytic or blastic bone lesion. UPPER ABDOMEN: Limited images of the upper abdomen demonstrates no acute abnormality. IMPRESSION: 1. No acute findings. 2. Moderate coronary artery calcification. 3. Moderate  cardiomegaly and central pulmonary artery enlargement, consistent with pulmonary arterial hypertension. 4. Indeterminate bilateral nodules posterior to the thyroid  gland measuring up to 3.0 cm on the left and 2.5 cm on the right; recommend non-emergent thyroid  ultrasound for further characterization. Electronically signed by: Dorethia Molt MD 06/07/2024 09:38 PM EST RP Workstation: [id number]   CT Lumbar Spine Wo Contrast Result Date: 06/07/2024 EXAM: CT OF THE LUMBAR SPINE WITHOUT CONTRAST 06/07/2024 09:03:59 PM TECHNIQUE: CT of the lumbar spine was performed without the administration of intravenous contrast. Multiplanar reformatted images are provided for review. Automated exposure control, iterative reconstruction, and/or weight based adjustment of the mA/kV was utilized to reduce the radiation dose to as low as reasonably achievable. COMPARISON: None available. CLINICAL HISTORY: Back trauma, no prior imaging (Age >= 16y) FINDINGS: BONES AND ALIGNMENT: Normal lumbar lordosis. No listhesis. Osteopenia noted. Remote compression deformities of T12 and L1 are noted with approximately 40-50% loss of height centrally with prior vertebroplasty having been performed at T12-L2. Bridging anterior disc osteophytes are seen from T11-L2. No acute fracture. Remaining vertebral body height is preserved. DEGENERATIVE CHANGES: There is diffuse disc space narrowing and a vacuum disc phenomenon throughout the lumbar spine in keeping with changes of diffuse severe degenerative disc disease. L1-L2: Posterior disc osteophyte complex results in marked severe central canal stenosis with an AP diameter of the spinal canal of approximately 5 mm with flattening of the thecal sac. Moderate right and severe left neural foraminal narrowing. Severe bilateral facet arthrosis. L2-L3: Broad-based disc bulge, severe bilateral facet arthrosis, in the lumbar region, hypertrophy may result in severe central canal stenosis within the subcarinal  zone with impingement of the right lateral recess. Severe left neural foraminal narrowing. Right neural foramen is widely patent. L3-L4: Broad-based disc bulge, severe right and moderate left facet arthrosis, and limited hypertrophy result in severe central canal stenosis within the subcarinal zone with effacement of the lateral recesses bilaterally. Left neural foraminal narrowing. L4-L5: Severe bilateral facet arthrosis, broad-based disc bulge, and laminar hypertrophy result in severe central canal stenosis. Effacement of the right lateral recess. Moderate right and moderate to severe left neural foraminal narrowing. L5-S1: No significant canal stenosis. No significant neural foraminal narrowing. Severe bilateral facet arthrosis. SOFT TISSUES: Extensive aortoiliac atherosclerotic calcification. Cystic structure within the mid pelvis may represent a fluid-filled residual uterine cavity, but it is incompletely assessed on this examination and is indeterminate. There  is a mural nodule identified within this cystic collection, incompletely assessed. Additional differential considerations include a nodule within a partially decompressed bladder lumen or a cystic ovarian mass. IMPRESSION: 1. No acute fracture. Removed fractures T12-L2 status post vertebroplasty. 2. Severe multilevel central canal stenosis, greatest at L1-L2, L2-L3, L3-L4, and L4-L5. 3. Severe facet arthrosis at L1-L2, L2-L3, L4-L5, and L5-S1. 4. Indeterminate mid-pelvic cystic structure with a mural nodule, incompletely assessed. Consider dedicated contrast-enhanced pelvic MRI for further characterization. Electronically signed by: Dorethia Molt MD 06/07/2024 09:33 PM EST RP Workstation: [id number]   CT Thoracic Spine Wo Contrast Result Date: 06/07/2024 EXAM: CT THORACIC SPINE WITHOUT CONTRAST 06/07/2024 09:03:59 PM TECHNIQUE: CT of the thoracic spine was performed without the administration of intravenous contrast. Multiplanar reformatted images are  provided for review. Automated exposure control, iterative reconstruction, and/or weight based adjustment of the mA/kV was utilized to reduce the radiation dose to as low as reasonably achievable. COMPARISON: None available. CLINICAL HISTORY: Back trauma, no prior imaging (Age >= 16y). FINDINGS: BONES AND ALIGNMENT: The osseous structures are diffusely osteopenic. Normal thoracic kyphosis. No listhesis. T12-L2 vertebroplasty has been performed with bridging anterior disc osteophytes involving at least T11-L2. Residual 30-40% loss of height of the T12 and L1 vertebral bodies. Remaining vertebral body height is preserved. No retropulsion. No acute fracture of the thoracic spine. No high-grade canal stenosis. DEGENERATIVE CHANGES: Endplate remodeling and vacuum disc phenomenon at T6-7 are seen throughout the thoracic spine, in keeping with changes of diffuse moderate degenerative disc disease. Moderate left neural foraminal narrowing at T10-11. Moderate right neural foraminal narrowing at T7-8. No high-grade neural foraminal narrowing. SOFT TISSUES: No paraspinal fluid collection or inflammatory change identified. No acute abnormality. IMPRESSION: 1. No acute fracture of the thoracic spine. Electronically signed by: Dorethia Molt MD 06/07/2024 09:23 PM EST RP Workstation: [id number]   CT Cervical Spine Wo Contrast Result Date: 06/07/2024 EXAM: CT CERVICAL SPINE WITHOUT CONTRAST 06/07/2024 09:03:59 PM TECHNIQUE: CT of the cervical spine was performed without the administration of intravenous contrast. Multiplanar reformatted images are provided for review. Automated exposure control, iterative reconstruction, and/or weight based adjustment of the mA/kV was utilized to reduce the radiation dose to as low as reasonably achievable. COMPARISON: 1 / 31 / 15 CLINICAL HISTORY: Neck trauma (Age >= 65y) FINDINGS: CERVICAL SPINE: BONES AND ALIGNMENT: No acute fracture or traumatic malalignment. DEGENERATIVE CHANGES: Age  commensurate multilevel spondylosis and facet arthropathy. No severe spinal canal narrowing. SOFT TISSUES: No prevertebral soft tissue swelling. IMPRESSION: 1. No acute abnormality of the cervical spine. Electronically signed by: Norman Gatlin MD 06/07/2024 09:16 PM EST RP Workstation: [id number]   CT Head Wo Contrast Result Date: 06/07/2024 EXAM: CT HEAD WITHOUT CONTRAST 06/07/2024 09:03:59 PM TECHNIQUE: CT of the head was performed without the administration of intravenous contrast. Automated exposure control, iterative reconstruction, and/or weight based adjustment of the mA/kV was utilized to reduce the radiation dose to as low as reasonably achievable. COMPARISON: 01/27/2020 CLINICAL HISTORY: Head trauma, minor (Age >= 65y) FINDINGS: BRAIN AND VENTRICLES: No acute hemorrhage. No evidence of acute infarct. No hydrocephalus. No extra-axial collection. No mass effect or midline shift. ORBITS: No acute abnormality. SINUSES: No acute abnormality. SOFT TISSUES AND SKULL: Diffuse calvarial hyperostosis. No acute soft tissue abnormality. No skull fracture. IMPRESSION: 1. No acute intracranial abnormality. Electronically signed by: Norman Gatlin MD 06/07/2024 09:13 PM EST RP Workstation: [id number]   DG Shoulder Left Result Date: 06/07/2024 EXAM: 1 VIEW(S) XRAY OF THE LEFT SHOULDER 06/07/2024 05:33:00 PM COMPARISON: Humerus radiograph of  01/27/2020. CLINICAL HISTORY: Pain. FINDINGS: BONES AND JOINTS: Glenohumeral joint is normally aligned. Osteopenia is present. Remote nonunited distal humeral shaft fracture is incompletely imaged. No malalignment. Degenerative irregularity of the acromioclavicular joint. Mild glenohumeral joint osteoarthritis. SOFT TISSUES: No abnormal calcifications. LINES AND TUBES: Incompletely imaged pacer / ICD. IMPRESSION: 1. No acute findings. 2. Degenerative irregularity of the acromioclavicular joint and mild glenohumeral osteoarthritis. 3. Remote nonunited distal humeral shaft  fracture, incompletely imaged. Electronically signed by: Rockey Kilts MD 06/07/2024 06:19 PM EST RP Workstation: [id number]     Procedures   Medications Ordered in the ED  ketorolac (TORADOL) 15 MG/ML injection 7.5 mg (has no administration in time range)  iohexol (OMNIPAQUE) 300 MG/ML solution 75 mL (75 mLs Intravenous Contrast Given 06/07/24 2036)   Medical Decision Making Amount and/or Complexity of Data Reviewed Labs: ordered. Radiology: ordered.  Risk OTC drugs. Prescription drug management.   This patient is a 89 year old female coming by son at bedside who presents to the ED for concern of left-sided rib pain post mechanical fall 2 days ago, noted to have had slipped off the toilet and guided down by her son against the tub with noting to have progressive pain since that time.  No neurological symptoms at this time.  On physical exam, patient is in no acute distress, afebrile, alert and orient x 4, speaking in full sentences, nontachypneic, nontachycardic.  Notably does have some tenderness to palpation over parascapular and left-sided paraspinal muscles to thoracic spine.  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  Is able to ambulate with assistance per her normal. Unremarkable exam otherwise.  With patient's mechanical fall with possible hitting of the head, CT scan and head, cervical spine, chest, thoracic spine and lumbar spine.  CT lumbar spine did note some severe stenosis but with no neurological symptoms, will have her continue follow-up with her spine surgeon which she is already currently seeing.  Imaging was otherwise unremarkable for any acute abnormalities.  Per patient's family's request, will send home with short course of anti-inflammatories at low dosing with concerns at this may also interfere with her fluoxetine, alerting her that she cannot take these both the same time.  Using Tylenol  and lidocaine patches as ideal pain  control.  Providing 1 dose of Toradol per their request here today.  Will have her continue follow-up with orthopedic surgery as needed as well as with her spine surgeon and PCP.  Incidental findings of thyroid  nodules and cystic structure in abdomen was noted will have her continue follow-up with PCP.  Patient case discussed with attending who agree with plan  Patient vital signs have remained stable throughout the course of patient's time in the ED. Low suspicion for any other emergent pathology at this time. I believe this patient is safe to be discharged. Provided strict return to ER precautions. Patient expressed agreement and understanding of plan. All questions were answered.  Differential diagnoses prior to evaluation: The emergent differential diagnosis includes, but is not limited to, fracture, ligamentous injury, neurovascular injury, dislocation, malalignment, intracranial bleed, cauda equina syndrome. This is not an exhaustive differential.   Past Medical History / Co-morbidities / Social History: Type II diabetes, syncope, CAD, HTN, falls, pacemaker, edema, GERD  Additional history: Chart reviewed. Pertinent results include:   Last seen by neurosurgery on 04/12/2024, complaining of mechanical back pain at that time.  Noted to be barely ambulating at that time.  Referred to home health.  Lab Tests/Imaging studies:  I personally interpreted labs/imaging and the pertinent results include: CBC unremarkable BMP notes a mildly elevated creatinine of 1.11 likely secondary to dehydration as patient noted that she had not been drinking much today. CT head does not show any acute intracranial abnormality CT cervical spine does not show any acute abnormality. CT thoracic spine does not show any acute abnormality CT lumbar spine does show severe facet arthrosis as well as severe multilevel central canal stenosis As well as indeterminate mid pelvis structure X-ray of left shoulder shows  degenerative irregularities and nonunited humeral fracture with no acute abnormalities with CT chest showing indeterminate bilateral nodules and thyroid  gland and moderate cardiomegaly I agree with the radiologist interpretation.   Medications: I ordered medication including Toradol , ibuprofen , lidocaine  patch.  I have reviewed the patients home medicines and have made adjustments as needed.  Critical Interventions: None  Social Determinants of Health: Spanish-speaking require interpretation, living at home with son  Disposition: After consideration of the diagnostic results and the patients response to treatment, I feel that the patient would benefit from discharge and shortness of breath.   emergency department workup does not suggest an emergent condition requiring admission or immediate intervention beyond what has been performed at this time. The plan is: Follow-up with likely surgery, follow-up with spinal surgery, follow-up with PCP, symptomatic management home. The patient is safe for discharge and has been instructed to return immediately for worsening symptoms, change in symptoms or any other concerns.  Final diagnoses:  Acute left-sided thoracic back pain  Fall, initial encounter    ED Discharge Orders          Ordered    ibuprofen  (ADVIL ) 200 MG tablet  Every 6 hours PRN        06/07/24 2323    lidocaine  (LIDODERM ) 5 %  Every 24 hours        06/07/24 2324               Beola Terrall RAMAN, PA-C 06/07/24 2334    Owynn Mosqueda S, PA-C 06/07/24 2335    Kingsley, Victoria K, DO 06/07/24 2341

## 2024-06-07 NOTE — Discharge Instructions (Addendum)
 You were seen today for muscle pain after a fall.  Your CT scans were reassuring that I have low suspicion for any emergent causes recent today.  It is most likely due to musculoskeletal pain as cause for your symptoms today.  I am sending in a short course of anti-inflammatories as well as lidocaine patches for you to use.  Do not use your fluoxetine with the anti-inflammatories as these can have harsh interactions.  Recommending that you only use the ibuprofen in instances of severe pain, using Tylenol  and the lidocaine patches instead first.  Follow-up with your PCP and orthopedic surgery if persistent pain persist.  Additionally follow-up with spinal surgery for reevaluation of your lumbar spine CT which did show some narrowing of the spine.  This is not an emergent condition but would need further evaluation by neurosurgery.  Additionally there was a small cystic structure noted in your abdomen which will need to be further evaluated by PCP,  as well as some thyroid  nodules which will need to be further evaluated by PCP. this is not an emergent issue at this time.  Take Tylenol  (acetominophen)  650mg  every 4-6 hours, as needed for pain or fever. Do not take more than 4,000 mg in a 24-hour period. As this may cause liver damage. While this is rare, if you begin to develop yellowing of the skin or eyes, stop taking and return to ER immediately.  Take Ibuprofen 200mg  every 4-6 hours for pain or fever, not exceeding 3,200 mg per day as more than 1800mg  can cause Stomach irritation, dizziness, kidney issues with long-term use.  Lo revisaron hoy por dolor muscular despus de una cada. Sus tomografas computarizadas me confirmaron que tengo poca sospecha de cualquier causa emergente reciente hoy. Lo ms probable es que sus sntomas de hoy se deban a dolor musculoesqueltico. Le estoy enviando un tratamiento corto con antiinflamatorios y parches de [place]. No use fluoxetina con los antiinflamatorios, ya  que pueden tener interacciones [place]. Le recomiendo que solo use ibuprofeno en casos de dolor intenso, y que primero use Tylenol  y los parches de [place]. Si el dolor persiste, consulte con su mdico de cabecera y con un cirujano ortopdico.   Tome Tylenol  (acetaminofn) 650 mg cada 4-6 horas, segn sea necesario para el dolor o la fiebre. No tome ms de 4000 mg en 24 horas, ya que esto puede causar dao heptico. Aunque esto es poco frecuente, si comienza a presentar coloracin amarillenta en la piel o los ojos, suspenda el tratamiento y acuda a urgencias inmediatamente.  Tome ibuprofeno 200 mg cada 4-6 horas para el dolor o la fiebre, sin exceder los 3200 mg al da, ya que ms de 1800 mg pueden causar irritacin estomacal, mareos y problemas renales con el uso prolongado.  Adems, se realizar un seguimiento con ciruga de columna para reevaluar la tomografa computarizada de la columna torcica, que mostr cierto estrechamiento. No se trata de una emergencia, pero requerira una evaluacin adicional por parte de neurociruga.  Adems, se observ una pequea estructura qustica en su abdomen que deber ser evaluada ms a fondo por un mdico de atencin primaria; este no es un problema emergente en este momento.  as como algunos ndulos tiroideos que debern ser evaluados ms a fondo por el mdico de cabecera.

## 2024-06-07 NOTE — ED Notes (Signed)
 Pt assisted to restroom with use of personal wheelchair, sling applied to LUE for comfort.

## 2025-04-02 ENCOUNTER — Ambulatory Visit: Admitting: Hematology and Oncology
